# Patient Record
Sex: Female | Born: 1985 | Race: Black or African American | Hispanic: No | Marital: Single | Smoking: Current every day smoker
Health system: Southern US, Community
[De-identification: ages and names within clinical notes are randomized; demographics above are authoritative.]

## PROBLEM LIST (undated history)

## (undated) DIAGNOSIS — J45909 Unspecified asthma, uncomplicated: Secondary | ICD-10-CM

---

## 2011-07-22 ENCOUNTER — Encounter: Payer: Self-pay | Admitting: Emergency Medicine

## 2011-07-22 ENCOUNTER — Emergency Department (HOSPITAL_BASED_OUTPATIENT_CLINIC_OR_DEPARTMENT_OTHER)
Admission: EM | Admit: 2011-07-22 | Discharge: 2011-07-22 | Disposition: A | Discharge status: Home / Self Care | Payer: Self-pay | Attending: Emergency Medicine | Admitting: Emergency Medicine

## 2011-07-22 DIAGNOSIS — F17200 Nicotine dependence, unspecified, uncomplicated: Secondary | ICD-10-CM | POA: Insufficient documentation

## 2011-07-22 DIAGNOSIS — J069 Acute upper respiratory infection, unspecified: Secondary | ICD-10-CM | POA: Insufficient documentation

## 2011-07-22 LAB — RAPID STREP SCREEN (MED CTR MEBANE ONLY): Streptococcus, Group A Screen (Direct): NEGATIVE

## 2011-07-22 NOTE — ED Provider Notes (Signed)
History     CSN: [id number] Arrival date & time: 07/22/2011 12:47 PM  Chief Complaint  Patient presents with  . Sore Throat    HPI  (Consider location/radiation/quality/duration/timing/severity/associated sxs/prior treatment)  Patient is a 25 y.o. female presenting with pharyngitis. The history is provided by the patient. No language interpreter was used.  Sore Throat This is a new problem. The current episode started today. The problem occurs constantly. The problem has been unchanged. Associated symptoms include congestion and a sore throat. The symptoms are aggravated by nothing. She has tried nothing for the symptoms. The treatment provided moderate relief.  Pt reports she has a cough and congestion.  Pt reports throat is sore.  History reviewed. No pertinent past medical history.  History reviewed. No pertinent past surgical history.  No family history on file.  History  Substance Use Topics  . Smoking status: Current Everyday Smoker  . Smokeless tobacco: Not on file  . Alcohol Use: No    OB History    Grav Para Term Preterm Abortions TAB SAB Ect Mult Living                  Review of Systems  Review of Systems  HENT: Positive for congestion, sore throat and rhinorrhea.   All other systems reviewed and are negative.    Allergies  Review of patient's allergies indicates no known allergies.  Home Medications  No current outpatient prescriptions on file.  Physical Exam    BP 133/70  Pulse 101  Temp 99 F (37.2 C)  Resp 18  SpO2 100%  LMP 07/20/2011  Physical Exam  Nursing note and vitals reviewed. Constitutional: She is oriented to person, place, and time. She appears well-developed and well-nourished.  HENT:  Head: Normocephalic and atraumatic.  Right Ear: External ear normal.  Left Ear: External ear normal.  Nose: Nose normal.  Mouth/Throat: Oropharynx is clear and moist.  Eyes: Conjunctivae and EOM are normal. Pupils are equal, round, and  reactive to light.  Neck: Normal range of motion. Neck supple.  Cardiovascular: Normal rate and regular rhythm.   Pulmonary/Chest: Effort normal and breath sounds normal.  Abdominal: Soft. Bowel sounds are normal.  Musculoskeletal: Normal range of motion.  Neurological: She is alert and oriented to person, place, and time. She has normal reflexes.  Skin: Skin is warm and dry.  Psychiatric: She has a normal mood and affect.    ED Course  Procedures (including critical care time)   Labs Reviewed  RAPID STREP SCREEN   No results found.   No diagnosis found.   MDM Pt counseled on viral illness.          Langston Masker, [place] 07/22/11 1342

## 2011-07-22 NOTE — ED Notes (Signed)
Pt c/o sore throat & nasal congestion since last pm; no relief w/ Alka- Seltzer at home.

## 2011-07-29 NOTE — ED Provider Notes (Signed)
Medical screening examination/treatment/procedure(s) were performed by non-physician practitioner and as supervising physician I was immediately available for consultation/collaboration.   Geoffery Lyons, MD 07/29/11 [phone]

## 2013-12-25 ENCOUNTER — Encounter (HOSPITAL_BASED_OUTPATIENT_CLINIC_OR_DEPARTMENT_OTHER): Payer: Self-pay | Admitting: Emergency Medicine

## 2013-12-25 ENCOUNTER — Emergency Department (HOSPITAL_BASED_OUTPATIENT_CLINIC_OR_DEPARTMENT_OTHER)
Admission: EM | Admit: 2013-12-25 | Discharge: 2013-12-25 | Disposition: A | Discharge status: Home / Self Care | Payer: Self-pay | Attending: Emergency Medicine | Admitting: Emergency Medicine

## 2013-12-25 DIAGNOSIS — Z87891 Personal history of nicotine dependence: Secondary | ICD-10-CM | POA: Insufficient documentation

## 2013-12-25 DIAGNOSIS — R51 Headache: Secondary | ICD-10-CM | POA: Insufficient documentation

## 2013-12-25 DIAGNOSIS — R519 Headache, unspecified: Secondary | ICD-10-CM

## 2013-12-25 DIAGNOSIS — Z3202 Encounter for pregnancy test, result negative: Secondary | ICD-10-CM | POA: Insufficient documentation

## 2013-12-25 DIAGNOSIS — R1110 Vomiting, unspecified: Secondary | ICD-10-CM | POA: Insufficient documentation

## 2013-12-25 LAB — URINALYSIS, ROUTINE W REFLEX MICROSCOPIC
BILIRUBIN URINE: NEGATIVE
GLUCOSE, UA: NEGATIVE mg/dL
HGB URINE DIPSTICK: NEGATIVE
KETONES UR: NEGATIVE mg/dL
Nitrite: NEGATIVE
PH: 7.5 (ref 5.0–8.0)
Protein, ur: NEGATIVE mg/dL
Specific Gravity, Urine: 1.027 (ref 1.005–1.030)
Urobilinogen, UA: 1 mg/dL (ref 0.0–1.0)

## 2013-12-25 LAB — URINE MICROSCOPIC-ADD ON

## 2013-12-25 LAB — BASIC METABOLIC PANEL
BUN: 16 mg/dL (ref 6–23)
CHLORIDE: 101 meq/L (ref 96–112)
CO2: 22 mEq/L (ref 19–32)
Calcium: 10 mg/dL (ref 8.4–10.5)
Creatinine, Ser: 0.7 mg/dL (ref 0.50–1.10)
GFR calc non Af Amer: >90 mL/min (ref >90)
Glucose, Bld: 83 mg/dL (ref 70–99)
POTASSIUM: 4.3 meq/L (ref 3.7–5.3)
Sodium: 138 mEq/L (ref 137–147)

## 2013-12-25 LAB — PREGNANCY, URINE: Preg Test, Ur: NEGATIVE

## 2013-12-25 MED ORDER — KETOROLAC TROMETHAMINE 30 MG/ML IJ SOLN
30.0000 mg | Freq: Once | INTRAMUSCULAR | Status: AC
  Administered 2013-12-25: 30 mg via INTRAVENOUS
  Filled 2013-12-25: qty 1

## 2013-12-25 MED ORDER — ONDANSETRON 4 MG PO TBDP: 4.0000 mg | ORAL_TABLET | Freq: Three times a day (TID) | ORAL | Status: DC | PRN

## 2013-12-25 MED ORDER — ONDANSETRON HCL 4 MG/2ML IJ SOLN
4.0000 mg | Freq: Once | INTRAMUSCULAR | Status: AC
  Administered 2013-12-25: 4 mg via INTRAVENOUS
  Filled 2013-12-25: qty 2

## 2013-12-25 MED ORDER — SODIUM CHLORIDE 0.9 % IV BOLUS (SEPSIS)
1000.0000 mL | Freq: Once | INTRAVENOUS | Status: AC
  Administered 2013-12-25: 1000 mL via INTRAVENOUS

## 2013-12-25 NOTE — Discharge Instructions (Signed)

## 2013-12-25 NOTE — ED Provider Notes (Signed)
CSN: [id number]     Arrival date & time 12/25/13  1345 History   First MD Initiated Contact with Patient 12/25/13 1359     Chief Complaint  Patient presents with  . Headache     (Consider location/radiation/quality/duration/timing/severity/associated sxs/prior Treatment) HPI Comments: Pt states that she started vomiting last night and she has had multiple episodes of vomiting and diarrhea today. Pt states that she is have abdominal cramping;lmp was 2 weeks ago  The history is provided by the patient. No language interpreter was used.    History reviewed. No pertinent past medical history. History reviewed. No pertinent past surgical history. No family history on file. History  Substance Use Topics  . Smoking status: Former Games developermoker  . Smokeless tobacco: Not on file  . Alcohol Use: No   OB History   Grav Para Term Preterm Abortions TAB SAB Ect Mult Living                 Review of Systems  Constitutional: Negative.   Respiratory: Negative.   Cardiovascular: Negative.       Allergies  Review of patient's allergies indicates no known allergies.  Home Medications  No current outpatient prescriptions on file. BP 133/71  Pulse 96  Resp 16  SpO2 100%  LMP 12/12/2013 Physical Exam  Nursing note and vitals reviewed. Constitutional: She is oriented to person, place, and time. She appears well-developed and well-nourished.  HENT:  Head: Normocephalic and atraumatic.  Cardiovascular: Normal rate and regular rhythm.   Pulmonary/Chest: Effort normal and breath sounds normal.  Abdominal: Soft. Bowel sounds are normal. There is no tenderness.  Musculoskeletal: Normal range of motion.  Neurological: She is oriented to person, place, and time.  Skin: Skin is warm and dry.  Psychiatric: She has a normal mood and affect.    ED Course  Procedures (including critical care time) Labs Review Labs Reviewed  URINALYSIS, ROUTINE W REFLEX MICROSCOPIC - Abnormal; Notable for the  following:    Leukocytes, UA SMALL (*)    All other components within normal limits  URINE CULTURE  PREGNANCY, URINE  BASIC METABOLIC PANEL  URINE MICROSCOPIC-ADD ON   Imaging Review No results found.  EKG Interpretation   None       MDM   Final diagnoses:  Vomiting  Headache    Pt is feeling better and tolerating WU:JWJXpo:will send home with zofran:no red flags associated with headache    Teressa LowerVrinda Jenilyn Magana, NP 12/25/13 1705

## 2013-12-25 NOTE — ED Notes (Signed)
D/c home with ride- rx x 1 given for zofran

## 2013-12-25 NOTE — ED Notes (Signed)
C/o HA across forehead x 2-3 hrs-vomited x 2 at work-left work-vomited x 2 since then-NAD-steady gait to tx area

## 2013-12-25 NOTE — ED Notes (Signed)
Pt given water 

## 2013-12-26 LAB — URINE CULTURE
Colony Count: NO GROWTH
Culture: NO GROWTH

## 2013-12-26 NOTE — ED Provider Notes (Signed)
Medical screening examination/treatment/procedure(s) were performed by non-physician practitioner and as supervising physician I was immediately available for consultation/collaboration.  EKG Interpretation   None         Dagmar HaitWilliam Ova Meegan, MD 12/26/13 [phone]

## 2014-08-24 ENCOUNTER — Encounter (HOSPITAL_BASED_OUTPATIENT_CLINIC_OR_DEPARTMENT_OTHER): Payer: Self-pay | Admitting: Emergency Medicine

## 2014-08-24 ENCOUNTER — Emergency Department (HOSPITAL_BASED_OUTPATIENT_CLINIC_OR_DEPARTMENT_OTHER)
Admission: EM | Admit: 2014-08-24 | Discharge: 2014-08-24 | Disposition: A | Discharge status: Home / Self Care | Payer: Self-pay | Attending: Emergency Medicine | Admitting: Emergency Medicine

## 2014-08-24 DIAGNOSIS — J45901 Unspecified asthma with (acute) exacerbation: Secondary | ICD-10-CM | POA: Insufficient documentation

## 2014-08-24 DIAGNOSIS — J069 Acute upper respiratory infection, unspecified: Secondary | ICD-10-CM | POA: Insufficient documentation

## 2014-08-24 DIAGNOSIS — R062 Wheezing: Secondary | ICD-10-CM

## 2014-08-24 DIAGNOSIS — Z87891 Personal history of nicotine dependence: Secondary | ICD-10-CM | POA: Insufficient documentation

## 2014-08-24 HISTORY — DX: Unspecified asthma, uncomplicated: J45.909

## 2014-08-24 LAB — RAPID STREP SCREEN (MED CTR MEBANE ONLY): Streptococcus, Group A Screen (Direct): NEGATIVE

## 2014-08-24 MED ORDER — DM-GUAIFENESIN ER 30-600 MG PO TB12: 1.0000 | ORAL_TABLET | Freq: Two times a day (BID) | ORAL | Status: DC

## 2014-08-24 MED ORDER — AEROCHAMBER PLUS FLO-VU MEDIUM MISC
1.0000 | Freq: Once | Status: AC
  Administered 2014-08-24: 1
  Filled 2014-08-24: qty 1

## 2014-08-24 MED ORDER — ALBUTEROL SULFATE HFA 108 (90 BASE) MCG/ACT IN AERS
2.0000 | INHALATION_SPRAY | Freq: Once | RESPIRATORY_TRACT | Status: AC
  Administered 2014-08-24: 2 via RESPIRATORY_TRACT
  Filled 2014-08-24: qty 6.7

## 2014-08-24 NOTE — ED Notes (Signed)
Reports she woke up with sore throat this morning - c/o SOB

## 2014-08-24 NOTE — ED Provider Notes (Signed)
Medical screening examination/treatment/procedure(s) were performed by non-physician practitioner and as supervising physician I was immediately available for consultation/collaboration.     Emelina Hinch, MD 08/24/14 2259 

## 2014-08-24 NOTE — Discharge Instructions (Signed)
Call for a follow up appointment with a Family or Primary Care Provider.  °Return if Symptoms worsen.   °Take medication as prescribed.  °Salt water gargles 3-4 times a day. °Drink plenty of fluids. ° ° ° °Emergency Department Resource Guide °1) Find a Doctor and Pay Out of Pocket °Although you won't have to find out who is covered by your insurance plan, it is a good idea to ask around and get recommendations. You will then need to call the office and see if the doctor you have chosen will accept you as a new patient and what types of options they offer for patients who are self-pay. Some doctors offer discounts or will set up payment plans for their patients who do not have insurance, but you will need to ask so you aren't surprised when you get to your appointment. ° °2) Contact Your Local Health Department °Not all health departments have doctors that can see patients for sick visits, but many do, so it is worth a call to see if yours does. If you don't know where your local health department is, you can check in your phone book. The CDC also has a tool to help you locate your state's health department, and many state websites also have listings of all of their local health departments. ° °3) Find a Walk-in Clinic °If your illness is not likely to be very severe or complicated, you may want to try a walk in clinic. These are popping up all over the country in pharmacies, drugstores, and shopping centers. They're usually staffed by nurse practitioners or physician assistants that have been trained to treat common illnesses and complaints. They're usually fairly quick and inexpensive. However, if you have serious medical issues or chronic medical problems, these are probably not your best option. ° °No Primary Care Doctor: °- Call [facility] Connect at  832-8000 - they can help you locate a primary care doctor that  accepts your insurance, provides certain services, etc. °- Physician Referral Service-  1-800-533-3463 ° °Chronic Pain Problems: °Organization         Address  Phone   Notes  °[facility]  (336) 297-2271 Patients need to be referred by their primary care doctor.  ° °Medication Assistance: °Organization         Address  Phone   Notes  °[place] [facility] [address] °[place], [place] [address] (336) 641-8030 --Must be a resident of [place] °-- Must have NO insurance coverage whatsoever (no Medicaid/ Medicare, etc.) °-- The pt. MUST have a primary care doctor that directs their care regularly and follows them in the community °  °[facility]  (866) 331-1348   °[employer]  (888) 892-1162   ° °Agencies that provide inexpensive medical care: °Organization         Address  Phone   Notes  °[facility]  (336) 832-8035   °[facility]    (336) 832-7272   °[facility] [address] °[place], [place] [address] (336) 832-4777   °[facility] [address], °[place] (336) 271-4999   °[facility]    (336) 373-0678   °[facility]    (336) 272-1050   °[facility] ° [address], [place] Phone:  (336) 832-4444, Fax:  (336) 832-4440 Hours of Operation:  9 am - 6 pm, M-F.  Also accepts Medicaid/Medicare and self-pay.  °[facility] ° [address], [place] Phone: (336) 832-3150, Fax: (336) 832-3151. Hours of Operation:  8:30 am - 5:30 pm, M-F.  Also accepts Medicaid and self-pay.  °[facility] [place] [address], [place] Phone: (336) 878-6027   °[facility] [address], [place], [place] (336)723-1848, Ext. 123 Mondays & Thursdays: 7-9 AM.  First 15 patients are seen on a first come, first serve basis. °  ° °Medicaid-accepting [place] Providers: ° °Organization         Address  Phone   Notes  °[facility] [address],  [place] (336) 641-2100 Also accepts self-pay patients.  °[facility] [address], [place] ° (336) 856-9996   °[facility] [address], [place] (336) 288-8857   °[facility] [address] [place] [address], [place] (336) 299-7000   °Ve[facility] [address], [place]  ° (336) 373-1557 Only accepts [place] Access Medicaid patients after they have their name applied to their card.  ° °Self-Pay (no insurance) in [place]: ° °Organization         Address  Phone   Notes  °Sickle Cell Patients, [facility] [address], [place] (336) 832-1970   ° [facility] [address], [place] (336) 832-4400   ° [facility] ° [address], [place] (336) 992-4800   °[facility]/Dr. Osei-Bonsu ° [address] [place] [address], [place] or [address], [place] (336) 841-8500 Phone number for both [place] and [place] locations is the same.  °[facility] [address], [place] (336) 299-0000   °[facility] [address] [place] [address], [place] or [address] (336) 852-7530 °(336) 878-2260   °[facility] [address], [place] (336) 350-1642, phone; (336) 294-5005, fax Sees patients 1st and 3rd Saturday of every month.  Must not qualify for public or private insurance (i.e. Medicaid, Medicare, [place] Health Choice, Veterans' Benefits) • Household income should be no more than 200% of the poverty level •The clinic cannot treat you if you are pregnant or think you are pregnant • Sexually transmitted diseases are not treated at the clinic.  ° ° °Dental Care: °Organization         Address  Phone  Notes  °[facility] [address], [place] (336) 641-6152 Accepts children up to age 21 who are enrolled in  Medicaid or [place] Health Choice; pregnant women with a Medicaid card; and children who have applied for Medicaid or [place] Health Choice, but were declined, whose parents can pay a reduced fee at time of service.  °[facility] [place]  [address], [place] (336) 641-7733 Accepts children up to age 21 who are enrolled in Medicaid or [place] Health Choice; pregnant women with a Medicaid card; and children who have applied for Medicaid or  Health Choice, but were declined, whose parents can pay a reduced fee at time of service.  °[facility] ° [address], [place] (336) 641-4533 Patients are seen by appointment only. Walk-ins are not accepted. [facility] will see patients 18 years of age and older. °Monday - Tuesday (8am-5pm) °Most Wednesdays (8:30-5pm) °$30 per visit,   cash only  °[facility] ° [address], [place] (336) 641-4533 Patients are seen by appointment only. Walk-ins are not accepted. [facility] will see patients 18 years of age and older. °One Wednesday Evening (Monthly: Volunteer Based).  $30 per visit, cash only  °[facility]  (919) 537-3737 for adults; Children under age 4, call Graduate Pediatric Dentistry at (919) [phone]. Children aged 4-14, please call (919) [phone] to request a pediatric application. ° Dental services are provided in all areas of dental care including fillings, crowns and bridges, complete and partial dentures, implants, gum treatment, root canals, and extractions. Preventive care is also provided. Treatment is provided to both adults and children. °Patients are selected via a lottery and there is often a waiting list. °  °[facility] [address], °[place] ° (336) 763-8833 [email or web address] °  °[facility] [address], [place], [place] (336)723-1848, Ext. 123 Second and Fourth Thursday of each month, opens at 6:30  AM; Clinic ends at 9 AM.  Patients are seen on a first-come first-served basis, and a limited number are seen during each clinic.  ° °[facility] ° [address], [place], [place] (336) 723-7904   Eligibility Requirements °You must have lived in [place], [place], or [place] counties for at least the last three months. °  You cannot be eligible for state or federal sponsored healthcare insurance, including Veterans Administration, Medicaid, or Medicare. °  You generally cannot be eligible for healthcare insurance through your employer.  °  How to apply: °Eligibility screenings are held every Tuesday and Wednesday afternoon from 1:00 pm until 4:00 pm. You do not need an appointment for the interview!  °[facility] [address], [place], [place] 336-631-2330   °[facility]  336-342-8273   °[facility]  336-703-3100   °[facility]  336-570-6415   ° °Behavioral Health Resources in the Community: °Intensive Outpatient Programs °Organization         Address  Phone  Notes  °[facility] [address], [place], [place] 336-878-6098   °[facility] [address], [place], [place] 336-832-9800   °[facility] [address], [place], [place] ° 336-882-2125   °[facility] [address],  °[place], [place] 1-800-853-5163 or 336-641-4981   °Substance Abuse Resources °Organization         Address  Phone  Notes  °[facility]  336-882-2125   °[facility]  336-784-9470   °[facility]  336-285-9073   °[facility]  336-845-3988   °Residential & Outpatient Substance Abuse Program  1-800-659-3381   °Psychological Services °Organization         Address  Phone  Notes  °[facility]  336- 832-9600   °[facility]  336- 378-7881   °[facility] [address], [place] 1-800-853-5163 or  336-641-4981   ° °Mobile Crisis Teams °Organization         Address  Phone  Notes  °[facility], Mobile Crisis Care Unit  1-877-626-1772   °Assertive °[facility] ° [address] [place], [place] 336-834-9664   °Sharon DeEsch [address] °[place] 336-554-5454   ° °Self-Help/Support Groups °Organization         Address  Phone               Notes  °[facility] [place] - variety of support groups  336- 373-1402 Call for more information  °Narcotics Anonymous (NA), [facility] [address], °[place]  2 meetings at this location  ° °Residential Treatment Programs °Organization         Address  Phone  Notes  °[facility] [address],    °[place]  1-866-801-8205   °[facility] ° [address], [place], [place] 704-293-8524   °[facility] [address], [place] 336-845-3988 Admissions: 8am-3pm M-F  °[facility] [address]    °[place], [place] 336-841-1104   °[facility] [address], [place], [place] 336-379-7146   °[facility] [address]  °[place], [place] 336-285-9073   °[facility] - Intensive Outpatient [address], [place], [place] 336-852-3033   °[facility] [address]  °[place], [place] 1-877-615-2722 or 336-784-9470   °[facility] [address] [place], [place] 336-227-7417 Accepts Medicaid  °[facility] [address]  °[place] 1-800-659-3381 Substance Abuse/Addiction Treatment  ° °[place] Behavioral Health Resources °Organization         Address  Phone  Notes  °[facility]  (888) 581-9988   °Julie Brannon, PhD [address] [place], [place]   (336) 349-5553 or (336) 951-0000   °[facility]   [address] °[place], [place] (336) 349-4454   °[facility] [address],  [place], [place] (336) 342-8316 Insurance/Medicaid/sponsorship through [facility]  °[facility] [address]                                    [place], [place] (336) 342-8316 Therapy/tele-psych/case  °[facility] [address]  ° [place], [place] (336) 349-2233    °Dr. Arfeen  (336) 349-4544   °[facility] [place]  [employer] [place] [facility] 1) [address], [place] °2) [address], [place] °3)  [address], [place] (336) 349-3220 °(336) 342-7768 ° °(336) 342-8140   °[place] [facility] (336) 342-1394 or (336) 342-3537 (After Hours)    ° °

## 2014-08-24 NOTE — ED Provider Notes (Signed)
CSN: [id number]     Arrival date & time 08/24/14  1712 History   First MD Initiated Contact with Patient 08/24/14 1724     Chief Complaint  Patient presents with  . Sore Throat     (Consider location/radiation/quality/duration/timing/severity/associated sxs/prior Treatment) HPI Comments: The patient is a 28 year old female past medical history of asthma presenting to the emergency Department chief complaint of sore throat since this morning. Patient reports worsened with swallowing. Patient also complains of cough. Patient reports multiple episodes of coughing spells this morning, with one episode of lightheadedness after spell, no syncope. Patient denies fever, chills. Patient denies known sick contacts. Last inhaler use over 1 year ago no PCP. Reports taking chloraseptic lozengers without full resolution of symptoms.  The history is provided by the patient. No language interpreter was used.    Past Medical History  Diagnosis Date  . Asthma    History reviewed. No pertinent past surgical history. No family history on file. History  Substance Use Topics  . Smoking status: Former Games developermoker  . Smokeless tobacco: Never Used  . Alcohol Use: No   OB History   Grav Para Term Preterm Abortions TAB SAB Ect Mult Living                 Review of Systems  Constitutional: Negative for fever and chills.  HENT: Positive for sore throat. Negative for ear pain.   Respiratory: Positive for cough.       Allergies  Review of patient's allergies indicates no known allergies.  Home Medications   Prior to Admission medications   Medication Sig Start Date End Date Taking? Authorizing Provider  ondansetron (ZOFRAN ODT) 4 MG disintegrating tablet Take 1 tablet (4 mg total) by mouth every 8 (eight) hours as needed for nausea or vomiting. 12/25/13   Teressa LowerVrinda Pickering, NP   BP 109/65  Pulse 88  Temp(Src) 98.5 F (36.9 C) (Oral)  Resp 24  SpO2 94%  LMP 08/21/2014 Physical Exam  Nursing note  and vitals reviewed. Constitutional: She appears well-developed and well-nourished. No distress.  Obese female  HENT:  Head: Normocephalic and atraumatic.  Right Ear: External ear normal.  Left Ear: External ear normal.  Mouth/Throat: No oropharyngeal exudate.  Neck: Normal range of motion.  Cardiovascular: Normal rate and regular rhythm.   Pulmonary/Chest: Effort normal. She has wheezes. She has no rales. She exhibits no tenderness.  Patient is able to speak in complete sentences. Minimal expiratory wheezing at bases.  Abdominal: Soft.  Skin: Skin is warm and dry. She is not diaphoretic.  Psychiatric: She has a normal mood and affect. Her behavior is normal.    ED Course  Procedures (including critical care time) Labs Review Labs Reviewed  RAPID STREP SCREEN  CULTURE, GROUP A STREP    Imaging Review No results found.   EKG Interpretation None      MDM   Final diagnoses:  URI (upper respiratory infection)  Wheezing   Pt rapid strep negative. Patients symptoms are consistent with URI, likely viral etiology. Afebrile. Patient has minimal expiratory wheezing, history of asthma out of inhaler will give 1 dose and inhaler for symptomatically treatment resources given. Discussed that antibiotics are not indicated for viral infections. Pt will be discharged with symptomatic treatment.  Verbalizes understanding and is agreeable with plan. Pt is hemodynamically stable & in NAD prior to dc. Meds given in ED:  Medications  albuterol (PROVENTIL HFA;VENTOLIN HFA) 108 (90 BASE) MCG/ACT inhaler 2 puff (2 puffs Inhalation Given  08/24/14 1744)  AEROCHAMBER PLUS FLO-VU MEDIUM device MISC 1 each (1 each Other Given 08/24/14 1747)    New Prescriptions   DEXTROMETHORPHAN-GUAIFENESIN (MUCINEX DM) 30-600 MG PER 12 HR TABLET    Take 1 tablet by mouth 2 (two) times daily.       Mellody DrownLauren Kalynne Womac, PA-C 08/24/14 [phone]

## 2014-08-26 LAB — CULTURE, GROUP A STREP

## 2015-03-15 ENCOUNTER — Emergency Department (HOSPITAL_BASED_OUTPATIENT_CLINIC_OR_DEPARTMENT_OTHER)
Admission: EM | Admit: 2015-03-15 | Discharge: 2015-03-16 | Disposition: A | Discharge status: Home / Self Care | Payer: Managed Care, Other (non HMO) | Attending: Emergency Medicine | Admitting: Emergency Medicine

## 2015-03-15 ENCOUNTER — Emergency Department (HOSPITAL_BASED_OUTPATIENT_CLINIC_OR_DEPARTMENT_OTHER): Payer: Managed Care, Other (non HMO)

## 2015-03-15 ENCOUNTER — Encounter (HOSPITAL_BASED_OUTPATIENT_CLINIC_OR_DEPARTMENT_OTHER): Payer: Self-pay | Admitting: *Deleted

## 2015-03-15 DIAGNOSIS — Z792 Long term (current) use of antibiotics: Secondary | ICD-10-CM | POA: Insufficient documentation

## 2015-03-15 DIAGNOSIS — Z3A12 12 weeks gestation of pregnancy: Secondary | ICD-10-CM | POA: Diagnosis not present

## 2015-03-15 DIAGNOSIS — Y9301 Activity, walking, marching and hiking: Secondary | ICD-10-CM | POA: Insufficient documentation

## 2015-03-15 DIAGNOSIS — S93402A Sprain of unspecified ligament of left ankle, initial encounter: Secondary | ICD-10-CM

## 2015-03-15 DIAGNOSIS — W01198A Fall on same level from slipping, tripping and stumbling with subsequent striking against other object, initial encounter: Secondary | ICD-10-CM | POA: Diagnosis not present

## 2015-03-15 DIAGNOSIS — Y998 Other external cause status: Secondary | ICD-10-CM | POA: Diagnosis not present

## 2015-03-15 DIAGNOSIS — Y9289 Other specified places as the place of occurrence of the external cause: Secondary | ICD-10-CM | POA: Diagnosis not present

## 2015-03-15 DIAGNOSIS — J45909 Unspecified asthma, uncomplicated: Secondary | ICD-10-CM | POA: Insufficient documentation

## 2015-03-15 DIAGNOSIS — O9A211 Injury, poisoning and certain other consequences of external causes complicating pregnancy, first trimester: Secondary | ICD-10-CM | POA: Insufficient documentation

## 2015-03-15 DIAGNOSIS — Z87891 Personal history of nicotine dependence: Secondary | ICD-10-CM | POA: Diagnosis not present

## 2015-03-15 DIAGNOSIS — M545 Low back pain, unspecified: Secondary | ICD-10-CM

## 2015-03-15 DIAGNOSIS — S3992XA Unspecified injury of lower back, initial encounter: Secondary | ICD-10-CM | POA: Diagnosis not present

## 2015-03-15 DIAGNOSIS — Z3490 Encounter for supervision of normal pregnancy, unspecified, unspecified trimester: Secondary | ICD-10-CM

## 2015-03-15 DIAGNOSIS — O99511 Diseases of the respiratory system complicating pregnancy, first trimester: Secondary | ICD-10-CM | POA: Diagnosis not present

## 2015-03-15 DIAGNOSIS — Z79899 Other long term (current) drug therapy: Secondary | ICD-10-CM | POA: Insufficient documentation

## 2015-03-15 DIAGNOSIS — W19XXXA Unspecified fall, initial encounter: Secondary | ICD-10-CM

## 2015-03-15 MED ORDER — OXYCODONE-ACETAMINOPHEN 5-325 MG PO TABS
1.0000 | ORAL_TABLET | Freq: Once | ORAL | Status: AC
  Administered 2015-03-15: 1 via ORAL
  Filled 2015-03-15: qty 1

## 2015-03-15 NOTE — ED Provider Notes (Signed)
CSN: [id number]     Arrival date & time 03/15/15  2159 History  This chart was scribed for Shelly ScottMartha Linker, MD by Abel PrestoKara Demonbreun, ED Scribe. This patient was seen in room MH02/MH02 and the patient's care was started at 10:24 PM.    Chief Complaint  Patient presents with  . Fall     The history is provided by the patient.   HPI Comments: Shelly Ramos is a 29 y.o. female who presents to the Emergency Department complaining of fall down 4 steps. Pt states she was walking down staircase when bannister she was using to stabilize herself gave way, causing her to fall onto her left side. Denies striking her head.  No LOC, no vomiting, no seizure activity. Pt was able to ambulate afterwards. Pt notes associated left hip pain, back pain, and left ankle pain. Pt has not taken any medication for relief. Pt is [redacted] weeks pregnant. No abdominal pain, no vaginal bleeding, Pt tearful in exam room but states she is mostly concerned about the fetus. Pt has NKDA. Pt denies abdominal pain, LOC, vaginal bleeding, and neck pain.   Past Medical History  Diagnosis Date  . Asthma    History reviewed. No pertinent past surgical history. No family history on file. History  Substance Use Topics  . Smoking status: Former Games developermoker  . Smokeless tobacco: Never Used  . Alcohol Use: No   OB History    Gravida Para Term Preterm AB TAB SAB Ectopic Multiple Living   1              Review of Systems  Gastrointestinal: Negative for abdominal pain.  Genitourinary: Negative for vaginal bleeding.  Musculoskeletal: Positive for myalgias, back pain and arthralgias. Negative for neck pain.  Neurological: Negative for syncope and headaches.  All other systems reviewed and are negative.     Allergies  Review of patient's allergies indicates no known allergies.  Home Medications   Prior to Admission medications   Medication Sig Start Date End Date Taking? Authorizing Provider  MetroNIDAZOLE (FLAGYL PO) Take by mouth.  States she is currently being treated for trich. Thinks she is taking flagyl   Yes Historical Provider, MD  Prenatal Vit-Fe Fumarate-FA (PRENATAL MULTIVITAMIN) TABS tablet Take 1 tablet by mouth daily at 12 noon.   Yes Historical Provider, MD  dextromethorphan-guaiFENesin (MUCINEX DM) 30-600 MG per 12 hr tablet Take 1 tablet by mouth 2 (two) times daily. 08/24/14   Mellody DrownLauren Parker, PA-C  ondansetron (ZOFRAN ODT) 4 MG disintegrating tablet Take 1 tablet (4 mg total) by mouth every 8 (eight) hours as needed for nausea or vomiting. 12/25/13   Teressa LowerVrinda Pickering, NP   BP 116/66 mmHg  Pulse 76  Temp(Src) 97.6 F (36.4 C) (Oral)  Resp 18  Ht 5\' 8"  (1.727 m)  Wt 228 lb (103.42 kg)  BMI 34.68 kg/m2  SpO2 100%  LMP 12/26/2014 (Approximate)  Vitals reviewed Physical Exam  Physical Examination: General appearance - alert, well appearing, and in no distress Mental status - alert, oriented to person, place, and time Head- NCAT Eyes - no conjunctival injection, no scleral icterus Neck - no midline tenderness to palpation Chest - clear to auscultation, no wheezes, rales or rhonchi, symmetric air entry Heart - normal rate, regular rhythm, normal S1, S2, no murmurs, rubs, clicks or gallops Abdomen - soft, nontender, nondistended, no masses or organomegaly, nabs Back exam - no midline tenderness to palpation, left sided paraspinal tenderness in lumbar region,  Mild left CVA tenderness  Neurological - alert, oriented x 3, strength 5/5 in extremities x 4, sensation intact Musculoskeletal -ttp over lateral left ankle, mild ttp overa lateral left hip but no pain with internal or external rotation of hip joint , otherwise no joint tenderness, deformity or swelling Extremities - peripheral pulses normal, no pedal edema, no clubbing or cyanosis Skin - normal coloration and turgor, no rashes Psych- anxious, tearful  ED Course  Procedures (including critical care time) DIAGNOSTIC STUDIES: Oxygen Saturation is 100%  on room air, normal by my interpretation.    COORDINATION OF CARE: 10:28 PM Discussed treatment plan with patient at beside, the patient agrees with the plan and has no further questions at this time.   Labs Review Labs Reviewed  URINALYSIS, ROUTINE W REFLEX MICROSCOPIC - Abnormal; Notable for the following:    Leukocytes, UA TRACE (*)    All other components within normal limits  PREGNANCY, URINE - Abnormal; Notable for the following:    Preg Test, Ur POSITIVE (*)    All other components within normal limits  URINE MICROSCOPIC-ADD ON - Abnormal; Notable for the following:    Squamous Epithelial / LPF FEW (*)    Bacteria, UA FEW (*)    All other components within normal limits    Imaging Review No results found.   EKG Interpretation None      MDM   Final diagnoses:  Fall, initial encounter  Left ankle sprain, initial encounter  Left-sided low back pain without sciatica  Pregnancy  pt presenting after fall down several stairs- approx 3- she is [redacted] weeks pregnant.  No abdominal pain, no vaginal bleeding.  Pain in left ankle, left hip and left back.  Xray of ankle obtained, doubt fracture of hip as no significant bony tenderness,  No pain with ROM.  No midline lumbar tenderness.  Will obtain urine to ensure no gross hematuria.  No indication of fetal harm as no abdominal pain or tenderness and no bleeding.  Pt signed out pending urine and xray of ankle/reassessment.    I personally performed the services described in this documentation, which was scribed in my presence. The recorded information has been reviewed and is accurate.     Shelly ScottMartha Linker, MD 03/17/15 [phone]

## 2015-03-15 NOTE — ED Notes (Addendum)
Pt states that she was walking down the stairs at her brother's house pta and the rail broke loose causing her to fall hitting her left flank/back area on the wooden steps. States she fell down a couple stairs.  Pt tearful on arrival. Denies any loc. States that she is [redacted] weeks pregnant. Also c/o left hip hurting and left ankle. Denies any vaginal bleeding or abd pain.  Was able to ambulate but painful.

## 2015-03-16 LAB — URINE MICROSCOPIC-ADD ON

## 2015-03-16 LAB — URINALYSIS, ROUTINE W REFLEX MICROSCOPIC
BILIRUBIN URINE: NEGATIVE
Glucose, UA: NEGATIVE mg/dL
Hgb urine dipstick: NEGATIVE
Ketones, ur: NEGATIVE mg/dL
NITRITE: NEGATIVE
PROTEIN: NEGATIVE mg/dL
SPECIFIC GRAVITY, URINE: 1.013 (ref 1.005–1.030)
UROBILINOGEN UA: 1 mg/dL (ref 0.0–1.0)
pH: 7 (ref 5.0–8.0)

## 2015-03-16 LAB — PREGNANCY, URINE: Preg Test, Ur: POSITIVE — AB

## 2015-03-16 NOTE — Discharge Instructions (Signed)
Ankle Sprain °An ankle sprain is an injury to the strong, fibrous tissues (ligaments) that hold the bones of your ankle joint together.  °CAUSES °An ankle sprain is usually caused by a fall or by twisting your ankle. Ankle sprains most commonly occur when you step on the outer edge of your foot, and your ankle turns inward. People who participate in sports are more prone to these types of injuries.  °SYMPTOMS  °· Pain in your ankle. The pain may be present at rest or only when you are trying to stand or walk. °· Swelling. °· Bruising. Bruising may develop immediately or within 1 to 2 days after your injury. °· Difficulty standing or walking, particularly when turning corners or changing directions. °DIAGNOSIS  °Your caregiver will ask you details about your injury and perform a physical exam of your ankle to determine if you have an ankle sprain. During the physical exam, your caregiver will press on and apply pressure to specific areas of your foot and ankle. Your caregiver will try to move your ankle in certain ways. An X-ray exam may be done to be sure a bone was not broken or a ligament did not separate from one of the bones in your ankle (avulsion fracture).  °TREATMENT  °Certain types of braces can help stabilize your ankle. Your caregiver can make a recommendation for this. Your caregiver may recommend the use of medicine for pain. If your sprain is severe, your caregiver may refer you to a surgeon who helps to restore function to parts of your skeletal system (orthopedist) or a physical therapist. °HOME CARE INSTRUCTIONS  °· Apply ice to your injury for 1-2 days or as directed by your caregiver. Applying ice helps to reduce inflammation and pain. °¨ Put ice in a plastic bag. °¨ Place a towel between your skin and the bag. °¨ Leave the ice on for 15-20 minutes at a time, every 2 hours while you are awake. °· Only take over-the-counter or prescription medicines for pain, discomfort, or fever as directed by  your caregiver. °· Elevate your injured ankle above the level of your heart as much as possible for 2-3 days. °· If your caregiver recommends crutches, use them as instructed. Gradually put weight on the affected ankle. Continue to use crutches or a cane until you can walk without feeling pain in your ankle. °· If you have a plaster splint, wear the splint as directed by your caregiver. Do not rest it on anything harder than a pillow for the first 24 hours. Do not put weight on it. Do not get it wet. You may take it off to take a shower or bath. °· You may have been given an elastic bandage to wear around your ankle to provide support. If the elastic bandage is too tight (you have numbness or tingling in your foot or your foot becomes cold and blue), adjust the bandage to make it comfortable. °· If you have an air splint, you may blow more air into it or let air out to make it more comfortable. You may take your splint off at night and before taking a shower or bath. Wiggle your toes in the splint several times per day to decrease swelling. °SEEK MEDICAL CARE IF:  °· You have rapidly increasing bruising or swelling. °· Your toes feel extremely cold or you lose feeling in your foot. °· Your pain is not relieved with medicine. °SEEK IMMEDIATE MEDICAL CARE IF: °· Your toes are numb or blue. °·   You have severe pain that is increasing. MAKE SURE YOU:   Understand these instructions.  Will watch your condition.  Will get help right away if you are not doing well or get worse. Document Released: 10/17/2005 Document Revised: 07/11/2012 Document Reviewed: 10/29/2011 [facility] Patient Information 2015 [place], [place]. This information is not intended to replace advice given to you by your health care provider. Make sure you discuss any questions you have with your health care provider. Back Pain, Adult Low back pain is very common. About 1 in 5 people have back pain.The cause of low back pain is rarely dangerous.  The pain often gets better over time.About half of people with a sudden onset of back pain feel better in just 2 weeks. About 8 in 10 people feel better by 6 weeks.  CAUSES Some common causes of back pain include: Strain of the muscles or ligaments supporting the spine. Wear and tear (degeneration) of the spinal discs. Arthritis. Direct injury to the back. DIAGNOSIS Most of the time, the direct cause of low back pain is not known.However, back pain can be treated effectively even when the exact cause of the pain is unknown.Answering your caregiver's questions about your overall health and symptoms is one of the most accurate ways to make sure the cause of your pain is not dangerous. If your caregiver needs more information, he or she may order lab work or imaging tests (X-rays or MRIs).However, even if imaging tests show changes in your back, this usually does not require surgery. HOME CARE INSTRUCTIONS For many people, back pain returns.Since low back pain is rarely dangerous, it is often a condition that people can learn to [facility]ageon their own.  Remain active. It is stressful on the back to sit or stand in one place. Do not sit, drive, or stand in one place for more than 30 minutes at a time. Take short walks on level surfaces as soon as pain allows.Try to increase the length of time you walk each day. Do not stay in bed.Resting more than 1 or 2 days can delay your recovery. Do not avoid exercise or work.Your body is made to move.It is not dangerous to be active, even though your back may hurt.Your back will likely heal faster if you return to being active before your pain is gone. Pay attention to your body when you bend and lift. Many people have less discomfortwhen lifting if they bend their knees, keep the load close to their bodies,and avoid twisting. Often, the most comfortable positions are those that put less stress on your recovering back. Find a comfortable position to sleep.  Use a firm mattress and lie on your side with your knees slightly bent. If you lie on your back, put a pillow under your knees. Only take over-the-counter or prescription medicines as directed by your caregiver. Over-the-counter medicines to reduce pain and inflammation are often the most helpful.Your caregiver may prescribe muscle relaxant drugs.These medicines help dull your pain so you can more quickly return to your normal activities and healthy exercise. Put ice on the injured area. Put ice in a plastic bag. Place a towel between your skin and the bag. Leave the ice on for 15-20 minutes, 03-04 times a day for the first 2 to 3 days. After that, ice and heat may be alternated to reduce pain and spasms. Ask your caregiver about trying back exercises and gentle massage. This may be of some benefit. Avoid feeling anxious or stressed.Stress increases muscle tension and  can worsen back pain.It is important to recognize when you are anxious or stressed and learn ways to manage it.Exercise is a great option. SEEK MEDICAL CARE IF: You have pain that is not relieved with rest or medicine. You have pain that does not improve in 1 week. You have new symptoms. You are generally not feeling well. SEEK IMMEDIATE MEDICAL CARE IF:  You have pain that radiates from your back into your legs. You develop new bowel or bladder control problems. You have unusual weakness or numbness in your arms or legs. You develop nausea or vomiting. You develop abdominal pain. You feel faint. Document Released: 10/17/2005 Document Revised: 04/17/2012 Document Reviewed: 02/18/2014 [facility]Exit[facility] Patient Information 2015 [place], [place]. This information is not intended to replace advice given to you by your health care provider. Make sure you discuss any questions you have with your health care provider.

## 2016-06-11 IMAGING — DX DG ANKLE COMPLETE 3+V*L*
3 series · 3 of 3 positions shown · non-contrast
Comparison: None.

CLINICAL DATA: Status post fall down 4 steps, with right ankle
pain. Initial encounter.

EXAM:
LEFT ANKLE COMPLETE - 3+ VIEW

[ankle ap]
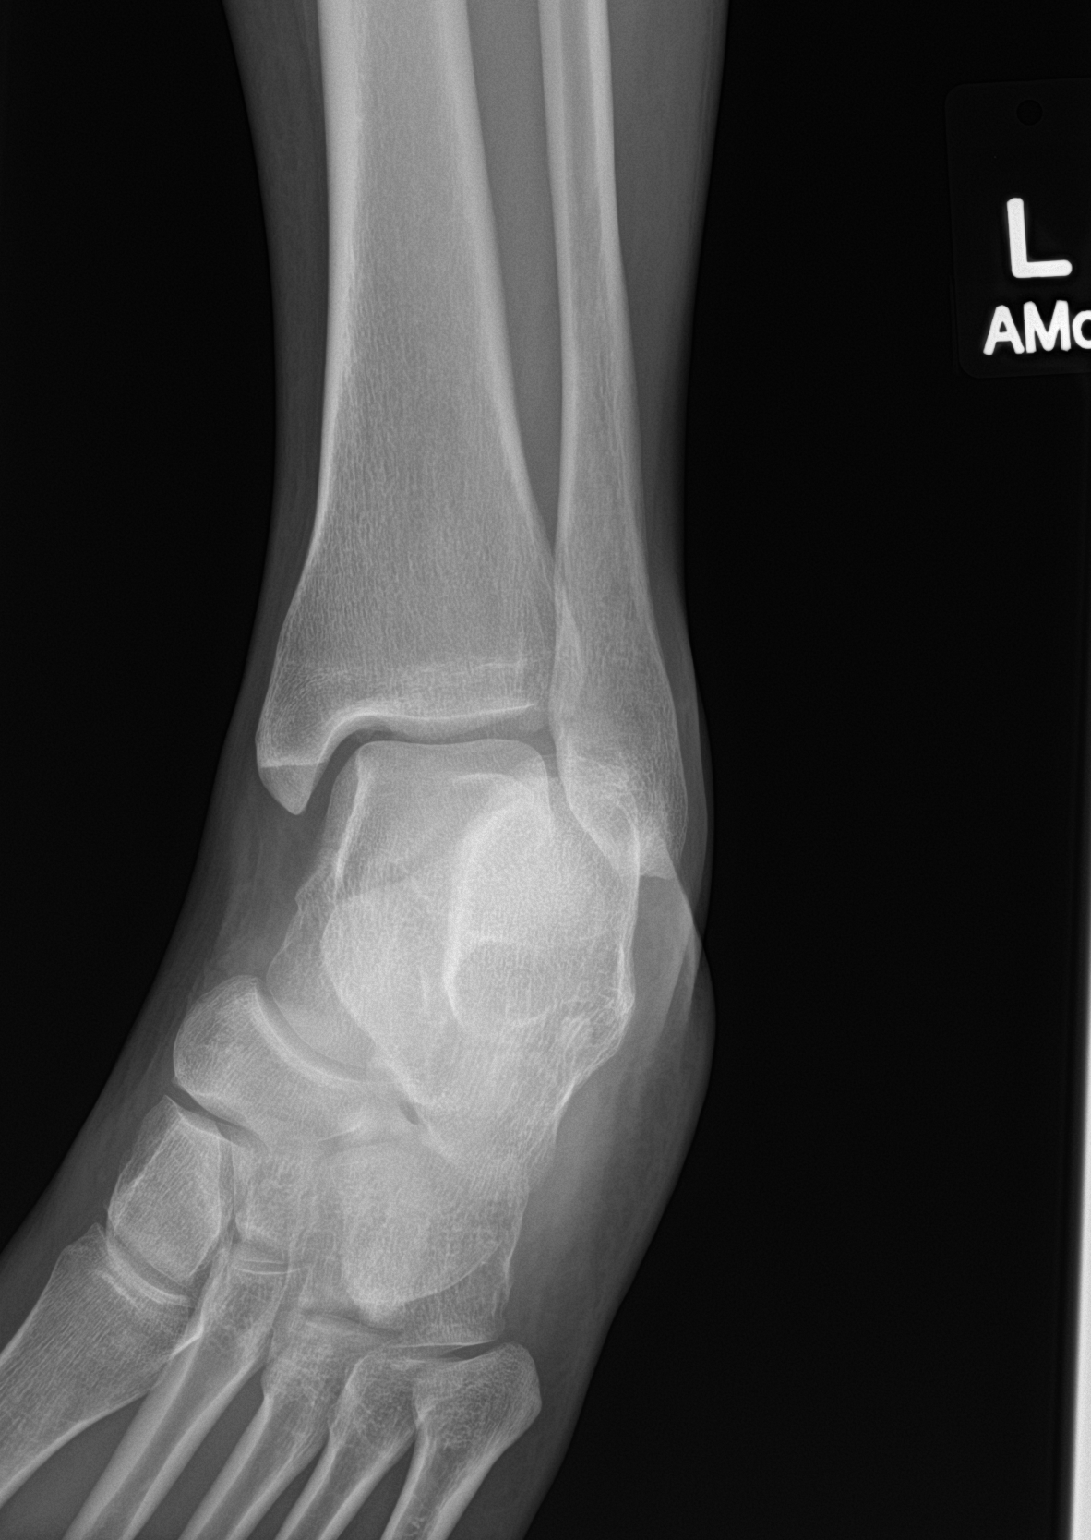

[ankle obl]
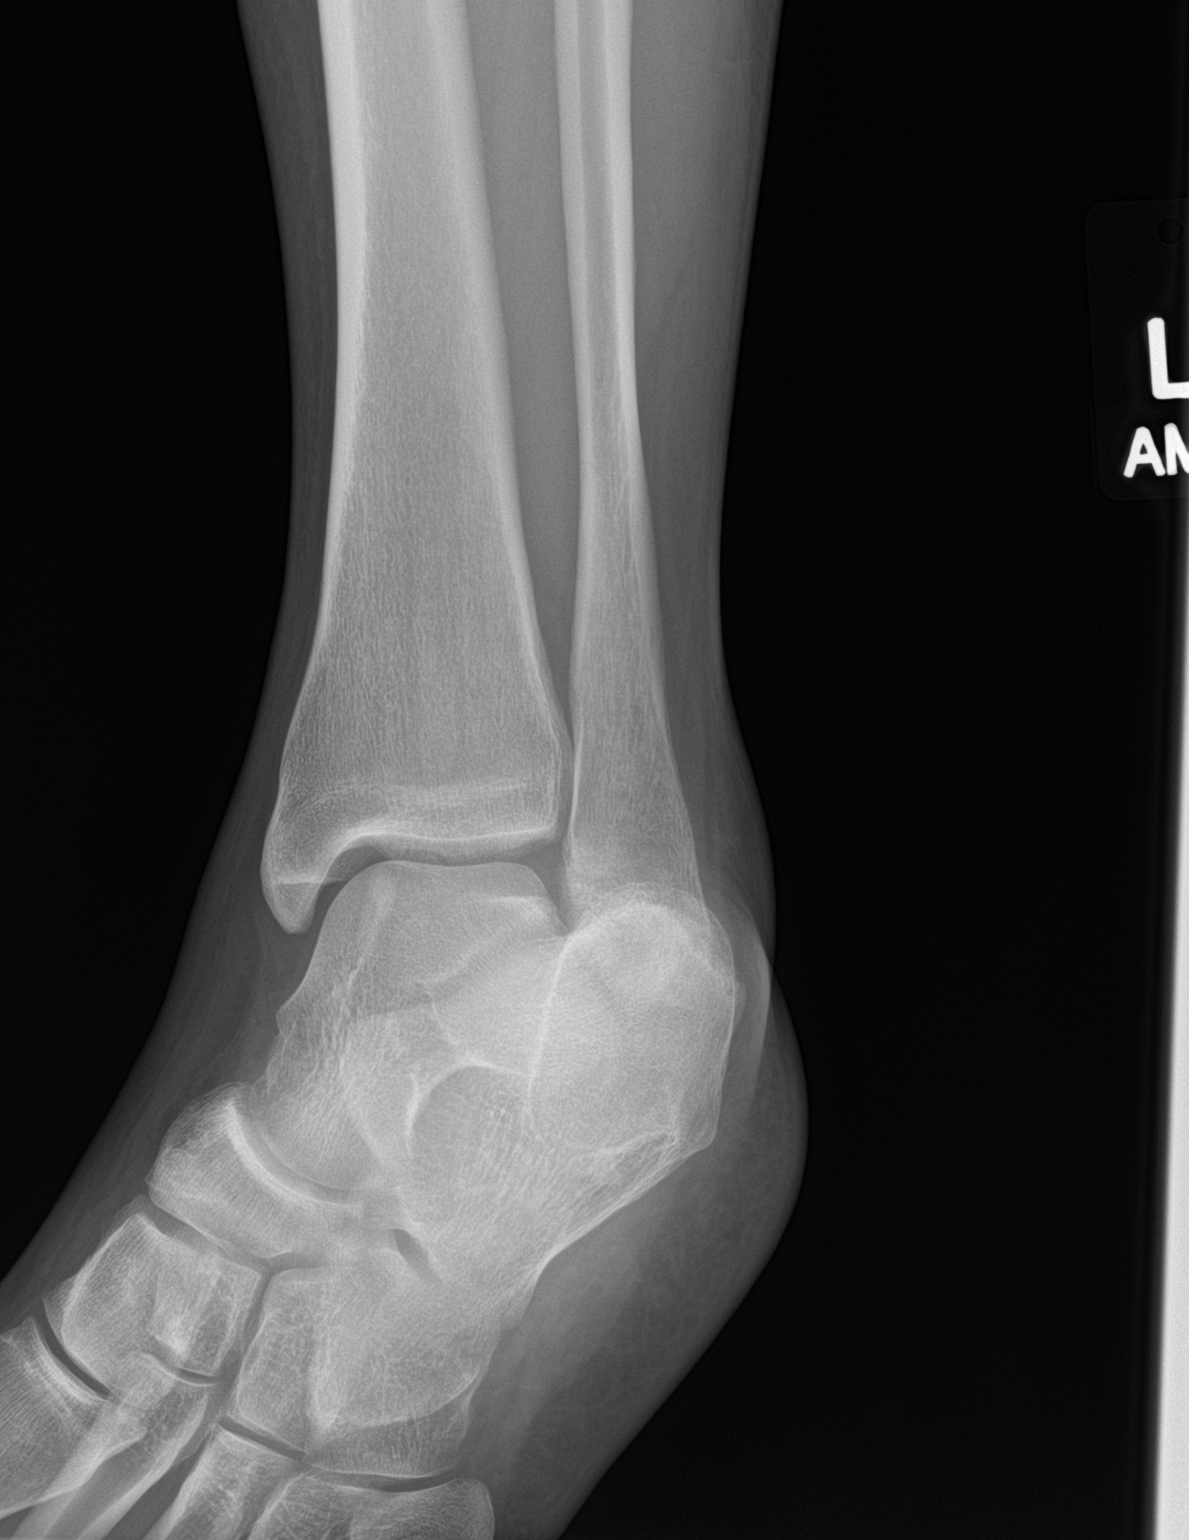

[ankle lat]
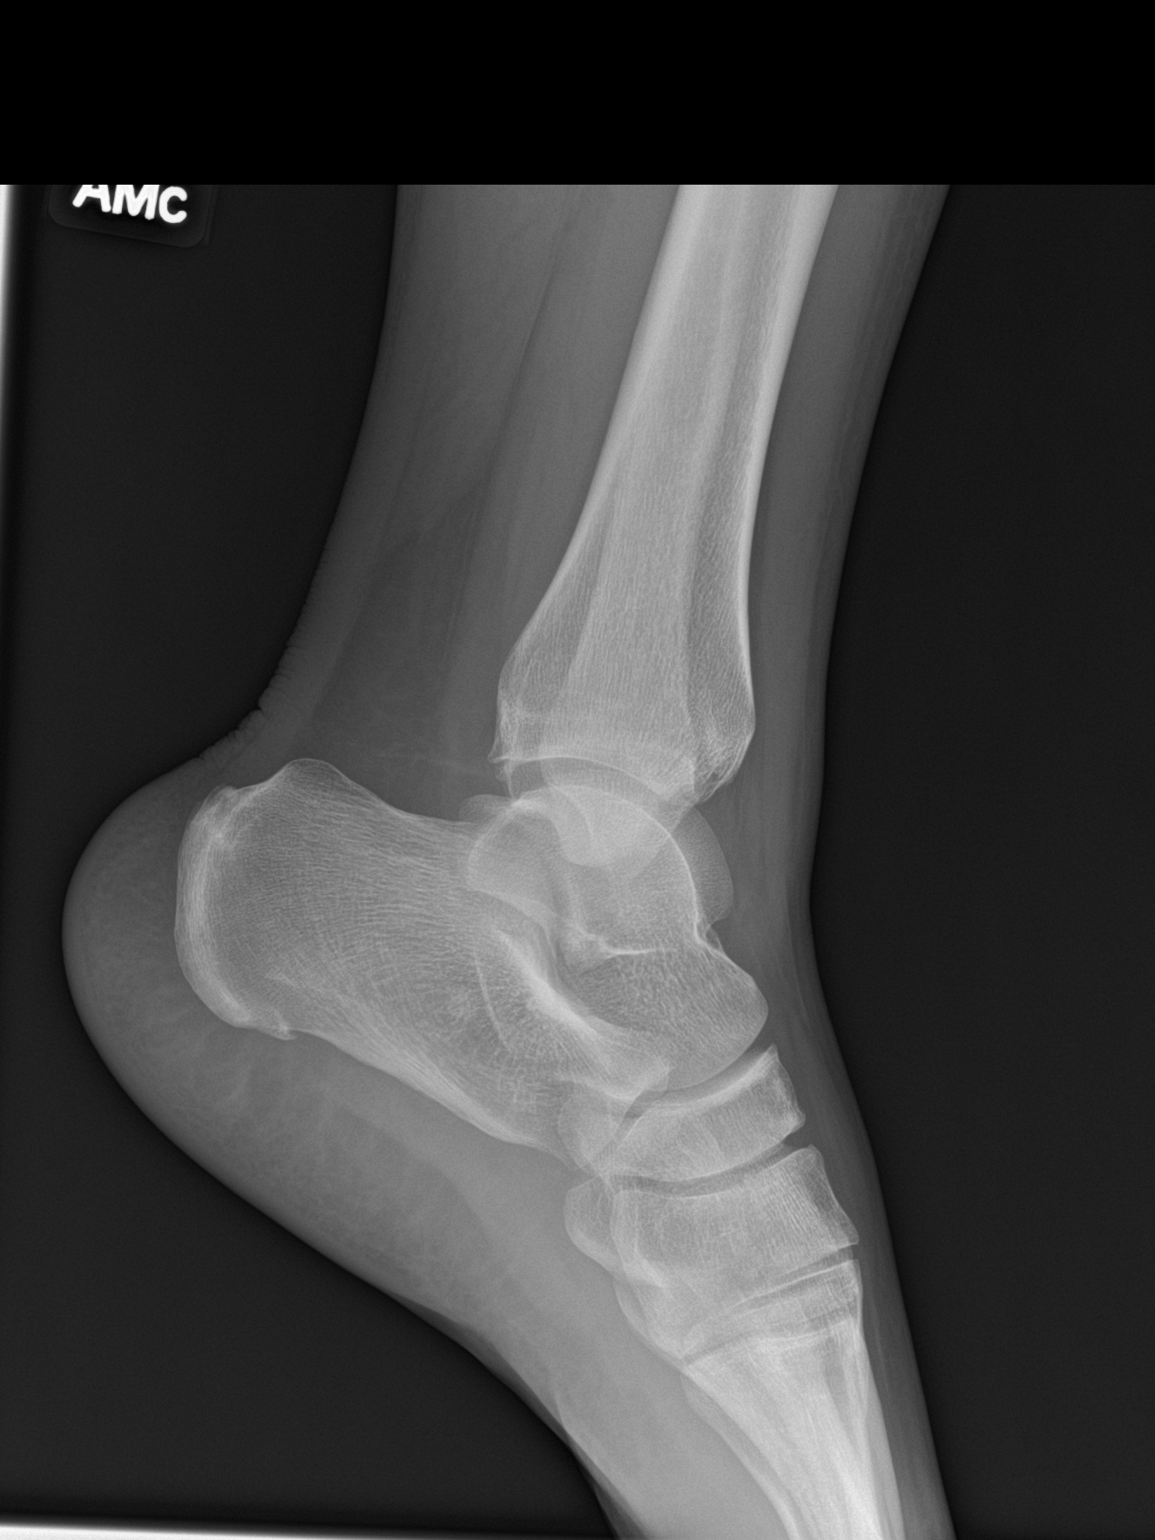

[3 of 3 positions shown; findings below may reference images not displayed]

FINDINGS: There is no evidence of fracture or dislocation. The ankle mortise
is intact; the interosseous space is within normal limits. No talar
tilt or subluxation is seen. A small plantar calcaneal spur is
noted.

The joint spaces are preserved. No significant soft tissue
abnormalities are seen.
IMPRESSION: No evidence of fracture or dislocation.

## 2017-05-11 ENCOUNTER — Emergency Department (HOSPITAL_BASED_OUTPATIENT_CLINIC_OR_DEPARTMENT_OTHER)
Admission: EM | Admit: 2017-05-11 | Discharge: 2017-05-11 | Discharge status: Against Medical Advice | Payer: Managed Care, Other (non HMO) | Attending: Emergency Medicine | Admitting: Emergency Medicine

## 2017-05-11 ENCOUNTER — Encounter (HOSPITAL_BASED_OUTPATIENT_CLINIC_OR_DEPARTMENT_OTHER): Payer: Self-pay | Admitting: *Deleted

## 2017-05-11 DIAGNOSIS — R05 Cough: Secondary | ICD-10-CM | POA: Insufficient documentation

## 2017-05-11 DIAGNOSIS — R197 Diarrhea, unspecified: Secondary | ICD-10-CM | POA: Insufficient documentation

## 2017-05-11 DIAGNOSIS — R52 Pain, unspecified: Secondary | ICD-10-CM | POA: Insufficient documentation

## 2017-05-11 DIAGNOSIS — Z5321 Procedure and treatment not carried out due to patient leaving prior to being seen by health care provider: Secondary | ICD-10-CM | POA: Insufficient documentation

## 2017-05-11 NOTE — ED Triage Notes (Signed)
Body aches, slight cough, diarrhea since yesterday.

## 2017-05-11 NOTE — ED Notes (Signed)
Called to treatment room with no answer from lobby 

## 2017-05-11 NOTE — ED Notes (Signed)
Called to triage for second time with no answer from lobby.

## 2018-10-02 ENCOUNTER — Emergency Department (HOSPITAL_BASED_OUTPATIENT_CLINIC_OR_DEPARTMENT_OTHER)
Admission: EM | Admit: 2018-10-02 | Discharge: 2018-10-02 | Disposition: A | Discharge status: Home / Self Care | Payer: Medicaid Other | Attending: Emergency Medicine | Admitting: Emergency Medicine

## 2018-10-02 ENCOUNTER — Other Ambulatory Visit: Payer: Self-pay

## 2018-10-02 ENCOUNTER — Encounter (HOSPITAL_BASED_OUTPATIENT_CLINIC_OR_DEPARTMENT_OTHER): Payer: Self-pay | Admitting: Emergency Medicine

## 2018-10-02 DIAGNOSIS — J45909 Unspecified asthma, uncomplicated: Secondary | ICD-10-CM | POA: Insufficient documentation

## 2018-10-02 DIAGNOSIS — R51 Headache: Secondary | ICD-10-CM | POA: Insufficient documentation

## 2018-10-02 DIAGNOSIS — F17210 Nicotine dependence, cigarettes, uncomplicated: Secondary | ICD-10-CM | POA: Insufficient documentation

## 2018-10-02 DIAGNOSIS — G8929 Other chronic pain: Secondary | ICD-10-CM

## 2018-10-02 DIAGNOSIS — R519 Headache, unspecified: Secondary | ICD-10-CM

## 2018-10-02 MED ORDER — DEXAMETHASONE SODIUM PHOSPHATE 10 MG/ML IJ SOLN
10.0000 mg | Freq: Once | INTRAMUSCULAR | Status: AC
  Administered 2018-10-02: 10 mg via INTRAVENOUS
  Filled 2018-10-02: qty 1

## 2018-10-02 MED ORDER — SODIUM CHLORIDE 0.9 % IV BOLUS
1000.0000 mL | Freq: Once | INTRAVENOUS | Status: AC
  Administered 2018-10-02: 1000 mL via INTRAVENOUS

## 2018-10-02 MED ORDER — METOCLOPRAMIDE HCL 5 MG/ML IJ SOLN
10.0000 mg | Freq: Once | INTRAMUSCULAR | Status: AC
  Administered 2018-10-02: 10 mg via INTRAVENOUS
  Filled 2018-10-02: qty 2

## 2018-10-02 MED ORDER — DIPHENHYDRAMINE HCL 50 MG/ML IJ SOLN
50.0000 mg | Freq: Once | INTRAMUSCULAR | Status: AC
  Administered 2018-10-02: 50 mg via INTRAVENOUS
  Filled 2018-10-02: qty 1

## 2018-10-02 NOTE — ED Triage Notes (Signed)
Pain in posterior right side of head for a month, that has been getting worse in the past week or so.  Sts it started with a bad tooth and she was given abx and [facility] but "it didn't  Help."  As of last night pain is unbearable. OTC meds not working.

## 2018-10-02 NOTE — ED Provider Notes (Signed)
[facility] EMERGENCY DEPARTMENT Provider Note   CSN: [id number] Arrival date & time: 10/02/18  0849     History   Chief Complaint Chief Complaint  Patient presents with  . Headache    HPI Shelly Ramos is a 32 y.o. female.  Patient with complaint of a throbbing headache to the right side of the head for 3 weeks.  Got worse recently.  Increased even more so last night.  No visual changes.  No fever no nausea or vomiting.  Patient has had some difficulty with a bad tooth on the right side.  But does not feel this if it is coming from the tooth.     Past Medical History:  Diagnosis Date  . Asthma     There are no active problems to display for this patient.   Past Surgical History:  Procedure Laterality Date  . CESAREAN SECTION       OB History    Gravida  1   Para      Term      Preterm      AB      Living        SAB      TAB      Ectopic      Multiple      Live Births               Home Medications    Prior to Admission medications   Medication Sig Start Date End Date Taking? Authorizing Provider  albuterol (PROVENTIL HFA;VENTOLIN HFA) 108 (90 Base) MCG/ACT inhaler Inhale into the lungs. 02/22/16  Yes [provider]    Family History No family history on file.  Social History Social History   Tobacco Use  . Smoking status: Current Every Day Smoker    Packs/day: 0.50    Types: Cigarettes  . Smokeless tobacco: Never Used  Substance Use Topics  . Alcohol use: No  . Drug use: No     Allergies   Hydrocodone   Review of Systems Review of Systems  Constitutional: Negative for fever.  HENT: Negative for congestion.   Eyes: Negative for photophobia and visual disturbance.  Respiratory: Negative for shortness of breath.   Cardiovascular: Negative for chest pain.  Gastrointestinal: Negative for abdominal pain.  Genitourinary: Negative for dysuria.  Musculoskeletal: Negative for back pain and neck  stiffness.  Skin: Negative for rash.  Hematological: Does not bruise/bleed easily.  Psychiatric/Behavioral: Negative for confusion.     Physical Exam Updated Vital Signs BP (!) 147/98 (BP Location: Right Arm)   Pulse 62   Temp 98.3 F (36.8 C) (Oral)   Resp 16   Ht 1.702 m (5\' 7" )   Wt 104.3 kg   LMP 09/24/2018   SpO2 100%   BMI 36.02 kg/m   Physical Exam  Constitutional: She is oriented to person, place, and time. She appears well-developed and well-nourished. No distress.  HENT:  Head: Normocephalic and atraumatic.  Mouth/Throat: Oropharynx is clear and moist.  Eyes: Pupils are equal, round, and reactive to light. Conjunctivae and EOM are normal.  Neck: Neck supple.  Cardiovascular: Normal rate, regular rhythm and normal heart sounds.  Pulmonary/Chest: Effort normal and breath sounds normal.  Abdominal: Soft. Bowel sounds are normal. There is no tenderness.  Musculoskeletal: Normal range of motion.  Neurological: She is alert and oriented to person, place, and time. No cranial nerve deficit or sensory deficit. She exhibits normal muscle tone. Coordination normal.  Skin: Skin  is warm.  Nursing note and vitals reviewed.    ED Treatments / Results  Labs (all labs ordered are listed, but only abnormal results are displayed) Labs Reviewed - No data to display  EKG None  Radiology No results found.  Procedures Procedures (including critical care time)  Medications Ordered in ED Medications  sodium chloride 0.9 % bolus 1,000 mL (0 mLs Intravenous Stopped 10/02/18 1056)  diphenhydrAMINE (BENADRYL) injection 50 mg (50 mg Intravenous Given 10/02/18 0951)  dexamethasone (DECADRON) injection 10 mg (10 mg Intravenous Given 10/02/18 0950)  metoCLOPramide (REGLAN) injection 10 mg (10 mg Intravenous Given 10/02/18 0953)     Initial Impression / Assessment and Plan / ED Course  I have reviewed the triage vital signs and the nursing notes.  Pertinent labs & imaging results  that were available during my care of the patient were reviewed by me and considered in my medical decision making (see chart for details).     Patient is neuro exam without any focal neuro deficits.  Headache is just right-sided.  The duration is somewhat concerning.  Recommend CT head however CT scanner is down today.  Does not have to be done on emergent basis.  But should be done over the next few days.  We will give patient follow-up with neurology.  Patient treated here with migraine cocktail to include Decadron Reglan and Benadryl and IV fluids and patient's headache is improving.  Do not think there is a direct correlation between the headache and the tooth they seem to be separate.  Patient states the tooth is not bothering her very much.  Patient nontoxic no acute distress.  Final Clinical Impressions(s) / ED Diagnoses   Final diagnoses:  Chronic intractable headache, unspecified headache type    ED Discharge Orders    None       Vanetta MuldersZackowski, Jshon Ibe, MD 10/02/18 1207

## 2018-10-02 NOTE — Discharge Instructions (Addendum)
Home and rest.  Would expect headache to be resolved by tomorrow.  As we discussed since she had a headache for would recommend outpatient CT of the head for further evaluation.  Return for any new or worse symptoms.  Referral information to neurology provided.  Work note provided.

## 2018-10-12 ENCOUNTER — Other Ambulatory Visit: Payer: Self-pay

## 2018-10-12 ENCOUNTER — Emergency Department (HOSPITAL_BASED_OUTPATIENT_CLINIC_OR_DEPARTMENT_OTHER)
Admission: EM | Admit: 2018-10-12 | Discharge: 2018-10-12 | Disposition: A | Discharge status: Home / Self Care | Payer: Medicaid Other | Attending: Emergency Medicine | Admitting: Emergency Medicine

## 2018-10-12 ENCOUNTER — Encounter (HOSPITAL_BASED_OUTPATIENT_CLINIC_OR_DEPARTMENT_OTHER): Payer: Self-pay | Admitting: Emergency Medicine

## 2018-10-12 DIAGNOSIS — R51 Headache: Secondary | ICD-10-CM | POA: Diagnosis present

## 2018-10-12 DIAGNOSIS — Z79899 Other long term (current) drug therapy: Secondary | ICD-10-CM | POA: Insufficient documentation

## 2018-10-12 DIAGNOSIS — F17210 Nicotine dependence, cigarettes, uncomplicated: Secondary | ICD-10-CM | POA: Insufficient documentation

## 2018-10-12 DIAGNOSIS — J45909 Unspecified asthma, uncomplicated: Secondary | ICD-10-CM | POA: Diagnosis not present

## 2018-10-12 DIAGNOSIS — R519 Headache, unspecified: Secondary | ICD-10-CM

## 2018-10-12 LAB — PREGNANCY, URINE: PREG TEST UR: NEGATIVE

## 2018-10-12 MED ORDER — KETOROLAC TROMETHAMINE 15 MG/ML IJ SOLN
15.0000 mg | Freq: Once | INTRAMUSCULAR | Status: AC
  Administered 2018-10-12: 15 mg via INTRAVENOUS
  Filled 2018-10-12: qty 1

## 2018-10-12 MED ORDER — DEXAMETHASONE SODIUM PHOSPHATE 10 MG/ML IJ SOLN
10.0000 mg | Freq: Once | INTRAMUSCULAR | Status: AC
  Administered 2018-10-12: 10 mg via INTRAVENOUS
  Filled 2018-10-12: qty 1

## 2018-10-12 MED ORDER — METOCLOPRAMIDE HCL 5 MG/ML IJ SOLN
10.0000 mg | Freq: Once | INTRAMUSCULAR | Status: AC
  Administered 2018-10-12: 10 mg via INTRAVENOUS
  Filled 2018-10-12: qty 2

## 2018-10-12 MED ORDER — SODIUM CHLORIDE 0.9 % IV BOLUS
1000.0000 mL | Freq: Once | INTRAVENOUS | Status: AC
  Administered 2018-10-12: 1000 mL via INTRAVENOUS

## 2018-10-12 MED ORDER — DIPHENHYDRAMINE HCL 50 MG/ML IJ SOLN
25.0000 mg | Freq: Once | INTRAMUSCULAR | Status: AC
  Administered 2018-10-12: 25 mg via INTRAVENOUS
  Filled 2018-10-12: qty 1

## 2018-10-12 NOTE — ED Triage Notes (Signed)
Pt states she is been having her head very sensitive to touch for the past 2 weeks like she have a rash and she is not been able to sleep.

## 2018-10-12 NOTE — ED Provider Notes (Signed)
[facility] Provider Note: Shelly Ramos Benjamyn Hestand, MD, FACEP  CSN: [id number] MRN: [id number] ARRIVAL: 10/12/18 at 0500 ROOM: [facility]05/[facility]05   CHIEF COMPLAINT  Headache   HISTORY OF PRESENT ILLNESS  10/12/18 5:08 AM Shelly Ramos is a 32 y.o. female who is had a right-sided throbbing headache for over a month.  She was seen in the ED on the third of this month and treated with a migraine cocktail IV.  She got relief for several days before the pain returned earlier this week.  The pain worsened overnight and is now severe.  The pain is located in her right parietal region.  That area of the scalp is tender to palpation.  She denies associated photophobia or nausea.    Past Medical History:  Diagnosis Date  . Asthma     Past Surgical History:  Procedure Laterality Date  . CESAREAN SECTION      No family history on file.  Social History   Tobacco Use  . Smoking status: Current Every Day Smoker    Packs/day: 0.50    Types: Cigarettes  . Smokeless tobacco: Never Used  Substance Use Topics  . Alcohol use: No  . Drug use: No    Prior to Admission medications   Medication Sig Start Date End Date Taking? Authorizing Provider  albuterol (PROVENTIL HFA;VENTOLIN HFA) 108 (90 Base) MCG/ACT inhaler Inhale into the lungs. 02/22/16   [provider]    Allergies Hydrocodone   REVIEW OF SYSTEMS  Negative except as noted here or in the History of Present Illness.   PHYSICAL EXAMINATION  Initial Vital Signs Blood pressure 133/77, pulse 75, temperature 98.1 F (36.7 C), temperature source Oral, resp. rate 16, height 5\' 7"  (1.702 m), weight 104.3 kg, last menstrual period 09/24/2018, SpO2 100 %, unknown if currently breastfeeding.  Examination General: Well-developed, well-nourished female in no acute distress; appearance consistent with age of record HENT: normocephalic; atraumatic; tenderness of right parietal scalp with partial loss of hair but no rash; no  tenderness to palpation of occipital scalp Eyes: pupils equal, round and reactive to light; extraocular muscles intact Neck: supple; no neck tenderness Heart: regular rate and rhythm Lungs: clear to auscultation bilaterally Abdomen: soft; nondistended; nontender; bowel sounds present Extremities: No deformity; full range of motion; pulses normal Neurologic: Awake, alert and oriented; motor function intact in all extremities and symmetric; no facial droop Skin: Warm and dry Psychiatric: Flat affect   RESULTS  Summary of this visit's results, reviewed by myself:   EKG Interpretation  Date/Time:    Ventricular Rate:    PR Interval:    QRS Duration:   QT Interval:    QTC Calculation:   R Axis:     Text Interpretation:        Laboratory Studies: Results for orders placed or performed during the hospital encounter of 10/12/18 (from the past 24 hour(s))  Pregnancy, urine     Status: None   Collection Time: 10/12/18  5:14 AM  Result Value Ref Range   Preg Test, Ur NEGATIVE NEGATIVE   Imaging Studies: No results found.  ED COURSE and MDM  Nursing notes and initial vitals signs, including pulse oximetry, reviewed.  Vitals:   10/12/18 0505 10/12/18 0506  BP: 133/77   Pulse: 75   Resp: 16   Temp: 98.1 F (36.7 C)   TempSrc: Oral   SpO2: 100%   Weight:  104.3 kg  Height:  5\' 7"  (1.702 m)   6:16 AM Patient's pain  resolved with migraine cocktail and scalp is nontender.  It is unclear if this represents a migraine-like headache, and occipital neuralgia (although she had no occipital nerve tenderness), or an inflammatory alopecia syndrome.  She is already been referred to neurology and she was advised to follow-up.  She was also advised that this could be due to alopecia areata which is sometimes accompanied by scalp pain.  She was advised to consult a dermatologist given the partial alopecia at the site of her pain; we do not have a dermatologist on call so she would need to  locate one using [employer] or equivalent.  PROCEDURES    ED DIAGNOSES     ICD-10-CM   1. Scalp pain R51        Sokhna Christoph, MD 10/12/18 [phone]

## 2020-08-29 ENCOUNTER — Other Ambulatory Visit: Payer: Self-pay

## 2020-08-29 ENCOUNTER — Emergency Department (HOSPITAL_BASED_OUTPATIENT_CLINIC_OR_DEPARTMENT_OTHER)
Admission: EM | Admit: 2020-08-29 | Discharge: 2020-08-30 | Disposition: A | Discharge status: Home / Self Care | Payer: Medicaid Other | Attending: Emergency Medicine | Admitting: Emergency Medicine

## 2020-08-29 ENCOUNTER — Encounter (HOSPITAL_BASED_OUTPATIENT_CLINIC_OR_DEPARTMENT_OTHER): Payer: Self-pay | Admitting: *Deleted

## 2020-08-29 DIAGNOSIS — J45909 Unspecified asthma, uncomplicated: Secondary | ICD-10-CM | POA: Diagnosis not present

## 2020-08-29 DIAGNOSIS — R079 Chest pain, unspecified: Secondary | ICD-10-CM | POA: Diagnosis present

## 2020-08-29 DIAGNOSIS — N63 Unspecified lump in unspecified breast: Secondary | ICD-10-CM | POA: Diagnosis not present

## 2020-08-29 DIAGNOSIS — F17210 Nicotine dependence, cigarettes, uncomplicated: Secondary | ICD-10-CM | POA: Diagnosis not present

## 2020-08-29 DIAGNOSIS — R002 Palpitations: Secondary | ICD-10-CM

## 2020-08-29 DIAGNOSIS — Z7951 Long term (current) use of inhaled steroids: Secondary | ICD-10-CM | POA: Insufficient documentation

## 2020-08-29 NOTE — ED Triage Notes (Signed)
Presents with left ant chest pain, onset approx 1 hour ago, states was washing clothes when chest pain begin. Describes chest pain as "my heart is beating abnormal" Denies SOB, diaphoresis, nausea or vomiting.

## 2020-08-29 NOTE — ED Notes (Signed)
Immediately brought to exam room 3, 12 lead ECG obtained and to the EDP for eval, placed on cont cardiac monitoring with cont POX and int NBP assessment.

## 2020-08-29 NOTE — ED Notes (Signed)
Pt states she recently had a miscarriage, states she was at 7 weeks 4 days.

## 2020-08-30 ENCOUNTER — Emergency Department (HOSPITAL_BASED_OUTPATIENT_CLINIC_OR_DEPARTMENT_OTHER): Payer: Medicaid Other

## 2020-08-30 ENCOUNTER — Encounter (HOSPITAL_BASED_OUTPATIENT_CLINIC_OR_DEPARTMENT_OTHER): Payer: Self-pay | Admitting: Emergency Medicine

## 2020-08-30 LAB — CBC WITH DIFFERENTIAL/PLATELET
Abs Immature Granulocytes: 0.04 10*3/uL (ref 0.00–0.07)
Basophils Absolute: 0.1 10*3/uL (ref 0.0–0.1)
Basophils Relative: 1 %
Eosinophils Absolute: 0.1 10*3/uL (ref 0.0–0.5)
Eosinophils Relative: 1 %
HCT: 29.2 % — ABNORMAL LOW (ref 36.0–46.0)
Hemoglobin: 9.1 g/dL — ABNORMAL LOW (ref 12.0–15.0)
Immature Granulocytes: 0 %
Lymphocytes Relative: 24 %
Lymphs Abs: 2.7 10*3/uL (ref 0.7–4.0)
MCH: 26.3 pg (ref 26.0–34.0)
MCHC: 31.2 g/dL (ref 30.0–36.0)
MCV: 84.4 fL (ref 80.0–100.0)
Monocytes Absolute: 0.7 10*3/uL (ref 0.1–1.0)
Monocytes Relative: 6 %
Neutro Abs: 7.7 10*3/uL (ref 1.7–7.7)
Neutrophils Relative %: 68 %
Platelets: 394 10*3/uL (ref 150–400)
RBC: 3.46 MIL/uL — ABNORMAL LOW (ref 3.87–5.11)
RDW: 14.6 % (ref 11.5–15.5)
WBC: 11.4 10*3/uL — ABNORMAL HIGH (ref 4.0–10.5)
nRBC: 0 % (ref 0.0–0.2)

## 2020-08-30 LAB — BASIC METABOLIC PANEL
Anion gap: 10 (ref 5–15)
BUN: 10 mg/dL (ref 6–20)
CO2: 23 mmol/L (ref 22–32)
Calcium: 9.2 mg/dL (ref 8.9–10.3)
Chloride: 104 mmol/L (ref 98–111)
Creatinine, Ser: 0.79 mg/dL (ref 0.44–1.00)
GFR, Estimated: >60 mL/min (ref >60)
Glucose, Bld: 125 mg/dL — ABNORMAL HIGH (ref 70–99)
Potassium: 3.8 mmol/L (ref 3.5–5.1)
Sodium: 137 mmol/L (ref 135–145)

## 2020-08-30 LAB — TROPONIN I (HIGH SENSITIVITY): Troponin I (High Sensitivity): 3 ng/L (ref <18)

## 2020-08-30 MED ORDER — IOHEXOL 350 MG/ML SOLN
100.0000 mL | Freq: Once | INTRAVENOUS | Status: AC | PRN
  Administered 2020-08-30: 100 mL via INTRAVENOUS

## 2020-08-30 NOTE — ED Provider Notes (Signed)
[facility] EMERGENCY DEPARTMENT Provider Note   CSN: [id number] Arrival date & time: 08/29/20  2343     History Chief Complaint  Patient presents with  . Chest Pain    Shelly Ramos is a 34 y.o. female.  The history is provided by the patient.  Palpitations Palpitations quality:  Fast Onset quality:  Sudden Timing:  Constant Progression:  Unchanged Chronicity:  New Context: not appetite suppressants, not blood loss and not caffeine   Relieved by:  Nothing Worsened by:  Nothing Ineffective treatments:  None tried Associated symptoms: no back pain, no diaphoresis, no dizziness, no hemoptysis, no leg pain and no shortness of breath   Risk factors: no hx of PE   Patient with sudden onset palpitations and discomfort.  Patient denies substance use.      Past Medical History:  Diagnosis Date  . Asthma     There are no problems to display for this patient.   Past Surgical History:  Procedure Laterality Date  . CESAREAN SECTION       OB History    Gravida  1   Para      Term      Preterm      AB      Living        SAB      TAB      Ectopic      Multiple      Live Births              History reviewed. No pertinent family history.  Social History   Tobacco Use  . Smoking status: Current Every Day Smoker    Packs/day: 0.50    Types: Cigarettes  . Smokeless tobacco: Never Used  Substance Use Topics  . Alcohol use: No  . Drug use: No    Home Medications Prior to Admission medications   Medication Sig Start Date End Date Taking? Authorizing Provider  albuterol (PROVENTIL HFA;VENTOLIN HFA) 108 (90 Base) MCG/ACT inhaler Inhale into the lungs. 02/22/16   [provider]    Allergies    Hydrocodone  Review of Systems   Review of Systems  Constitutional: Negative for diaphoresis.  HENT: Negative for congestion.   Eyes: Negative for visual disturbance.  Respiratory: Negative for hemoptysis and shortness of breath.    Cardiovascular: Positive for palpitations.  Gastrointestinal: Negative for abdominal pain.  Genitourinary: Negative for difficulty urinating.  Musculoskeletal: Negative for back pain.  Skin: Negative for rash.  Neurological: Negative for dizziness.  Psychiatric/Behavioral: Negative for agitation.  All other systems reviewed and are negative.   Physical Exam Updated Vital Signs BP 119/69 (BP Location: Right Arm)   Pulse 98   Temp 98.3 F (36.8 C) (Oral)   Resp (!) 24   Ht 5\' 7"  (1.702 m)   Wt 114.8 kg   SpO2 100%   BMI 39.63 kg/m   Physical Exam Vitals and nursing note reviewed.  Constitutional:      General: She is not in acute distress.    Appearance: Normal appearance.     Comments: Smells strongly of marijuana   HENT:     Head: Normocephalic and atraumatic.     Nose: Nose normal.  Eyes:     Conjunctiva/sclera: Conjunctivae normal.     Pupils: Pupils are equal, round, and reactive to light.  Cardiovascular:     Rate and Rhythm: Normal rate and regular rhythm.     Pulses: Normal pulses.     Heart  sounds: Normal heart sounds.  Pulmonary:     Effort: Pulmonary effort is normal.     Breath sounds: Normal breath sounds.  Abdominal:     General: Abdomen is flat. Bowel sounds are normal.     Palpations: Abdomen is soft.     Tenderness: There is no abdominal tenderness. There is no guarding.  Musculoskeletal:        General: No tenderness. Normal range of motion.     Cervical back: Normal range of motion and neck supple.     Right lower leg: No edema.     Left lower leg: No edema.  Skin:    General: Skin is warm and dry.     Capillary Refill: Capillary refill takes less than 2 seconds.  Neurological:     General: No focal deficit present.     Mental Status: She is alert and oriented to person, place, and time.     Deep Tendon Reflexes: Reflexes normal.  Psychiatric:        Mood and Affect: Mood normal.        Behavior: Behavior normal.     ED Results /  Procedures / Treatments   Labs (all labs ordered are listed, but only abnormal results are displayed) Labs Reviewed  CBC WITH DIFFERENTIAL/PLATELET - Abnormal; Notable for the following components:      Result Value   WBC 11.4 (*)    RBC 3.46 (*)    Hemoglobin 9.1 (*)    HCT 29.2 (*)    All other components within normal limits  TROPONIN I (HIGH SENSITIVITY)    EKG EKG Interpretation  Date/Time:  Saturday August 29 2020 23:52:29 EDT Ventricular Rate:  105 PR Interval:    QRS Duration: 84 QT Interval:  318 QTC Calculation: 421 R Axis:   39 Text Interpretation: Sinus tachycardia Confirmed by Nicanor Alcon, Kambria Grima ([id number]) on 08/30/2020 12:33:22 AM   Radiology No results found.  Procedures Procedures (including critical care time)  Medications Ordered in ED Medications - No data to display  ED Course  I have reviewed the triage vital signs and the nursing notes.  Pertinent labs & imaging results that were available during my care of the patient were reviewed by me and considered in my medical decision making (see chart for details).   I suspect the palpitations are related to marijuana.  Ruled out for Mi and PE in the ED.  With nurse present patient informed of multiple breast masses seen on CTA and need for close follow up with breast imaging.  The number of [facility] wellness and the breast center was given to the patient to call to arrange close follow up and imaging.  EDP stressed this needed to be done very soon given radiology finding on CT scan.  Patient expressed understanding and agrees to follow up for ongoing testing and care of breast masses seen on CT scan.    Shelly Ramos was evaluated in Emergency Department on 08/30/2020 for the symptoms described in the history of present illness. She was evaluated in the context of the global COVID-19 pandemic, which necessitated consideration that the patient might be at risk for infection with the SARS-CoV-2 virus  that causes COVID-19. Institutional protocols and algorithms that pertain to the evaluation of patients at risk for COVID-19 are in a state of rapid change based on information released by regulatory bodies including the CDC and federal and state organizations. These policies and algorithms were followed during the patient's care in  the ED.  Final Clinical Impression(s) / ED Diagnoses Return for intractable cough, coughing up blood,fevers >100.4 unrelieved by medication, shortness of breath, intractable vomiting, chest pain, shortness of breath, weakness,numbness, changes in speech, facial asymmetry,abdominal pain, passing out,Inability to tolerate liquids or food, cough, altered mental status or any concerns. No signs of systemic illness or infection. The patient is nontoxic-appearing on exam and vital signs are within normal limits.   I have reviewed the triage vital signs and the nursing notes. Pertinent labs &imaging results that were available during my care of the patient were reviewed by me and considered in my medical decision making (see chart for details).After history, exam, and medical workup I feel the patient has beenappropriately medically screened and is safe for discharge home. Pertinent diagnoses were discussed with the patient. Patient was given return precautions.    Draydon Clairmont, MD 08/30/20 [id number]

## 2020-08-30 NOTE — ED Notes (Signed)
This RN at bedside with EDP to notify pt of CT scan results to follow up with OB-GYN as soon as possible. Pt verbalize understanding.

## 2021-11-27 IMAGING — CT CT ANGIO CHEST
2 of 8 series · 19 of 36 positions shown · IV contrast (Omnipaque)
Comparison: None.

CLINICAL DATA: Left-sided chest pain and palpitations.

EXAM:
CT ANGIOGRAPHY CHEST WITH CONTRAST
TECHNIQUE: Multidetector CT imaging of the chest was performed using the
standard protocol during bolus administration of intravenous
contrast. Multiplanar CT image reconstructions and MIPs were
obtained to evaluate the vascular anatomy.
CONTRAST:  100mL OMNIPAQUE IOHEXOL 350 MG/ML SOLN

[Series 6: pe coronal mpr · coronal · 0.54mm/px · 1 of 149 slices shown]
[im 75/149  mediastinal]
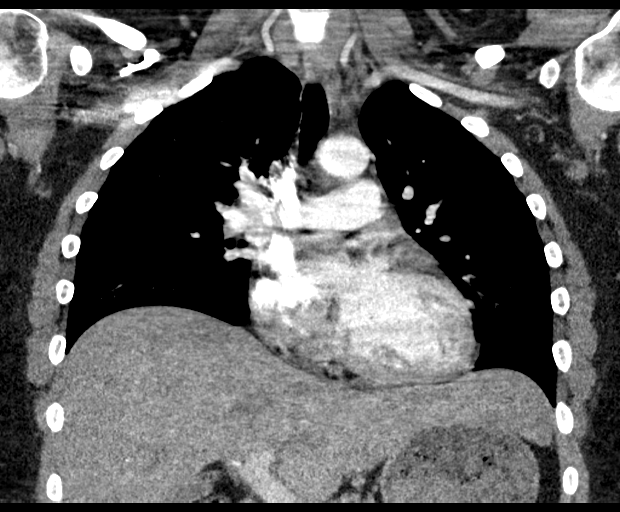

[Series 10: pe thins · axial · 0.73mm/px · z∈[+834,+1070]mm · 18 of 265 slices shown]
[im 14/265  lung]
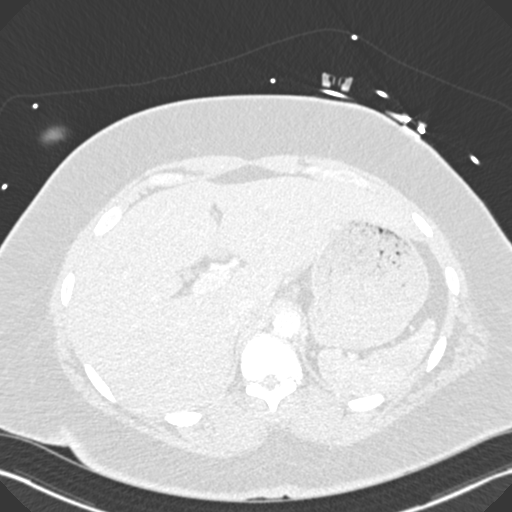
[im 28/265  mediastinal]
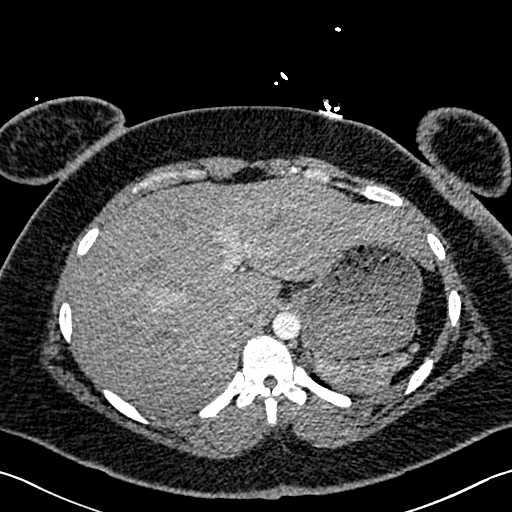
[im 42/265  lung]
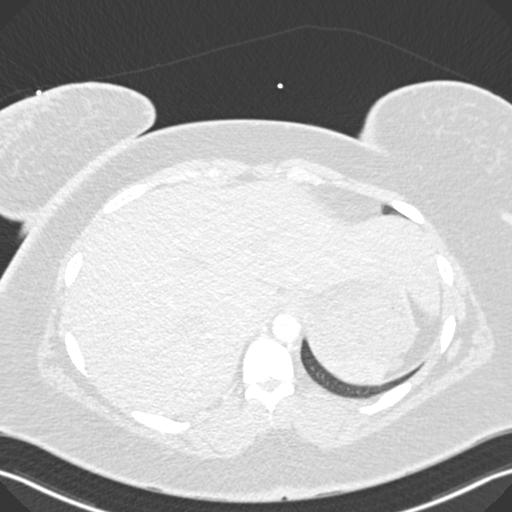
[im 56/265  mediastinal]
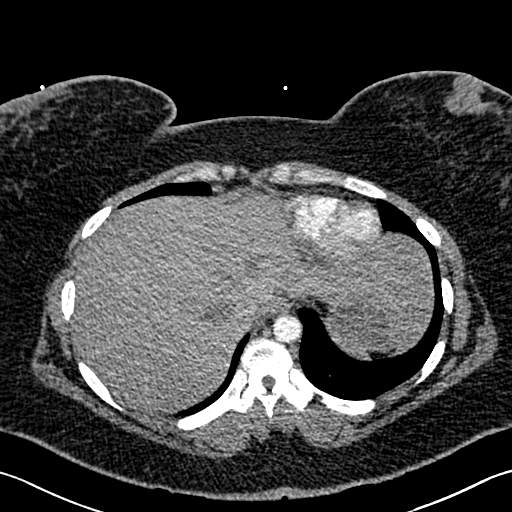
[im 70/265  lung]
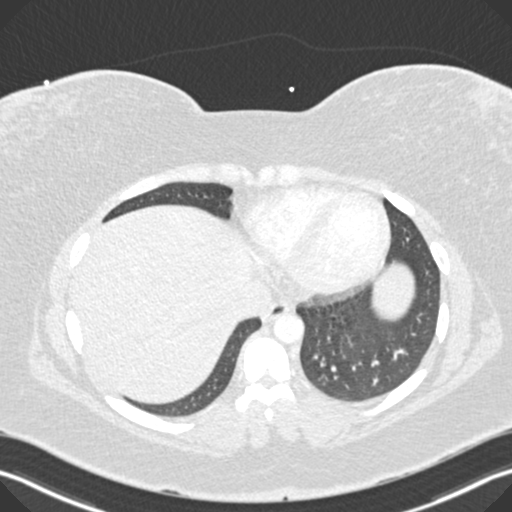
[im 84/265  mediastinal]
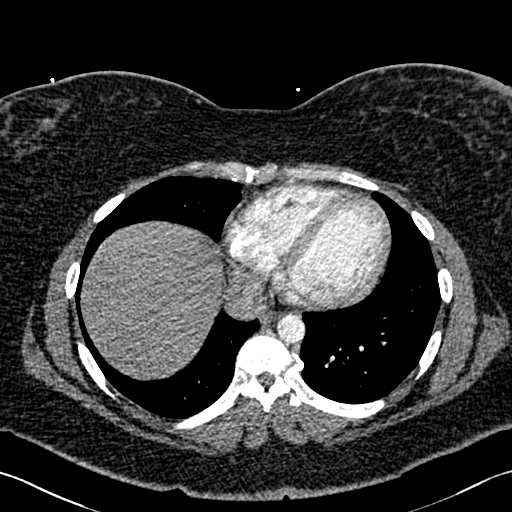
[im 98/265  lung]
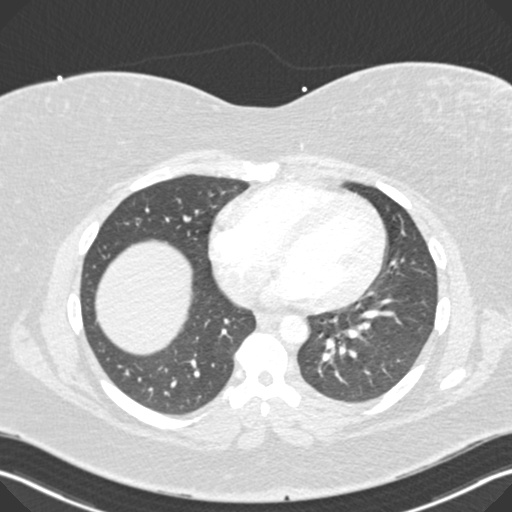
[im 112/265  mediastinal]
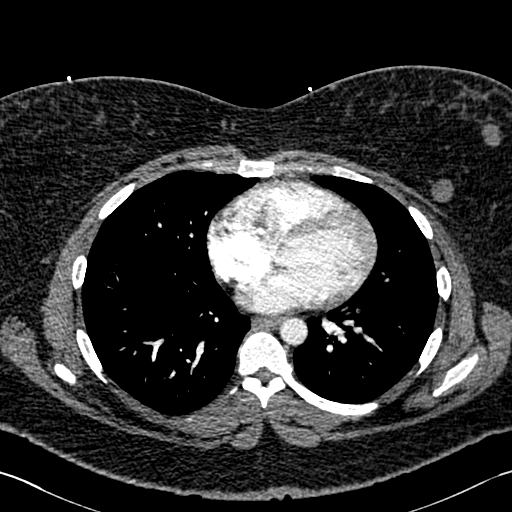
[im 126/265  lung]
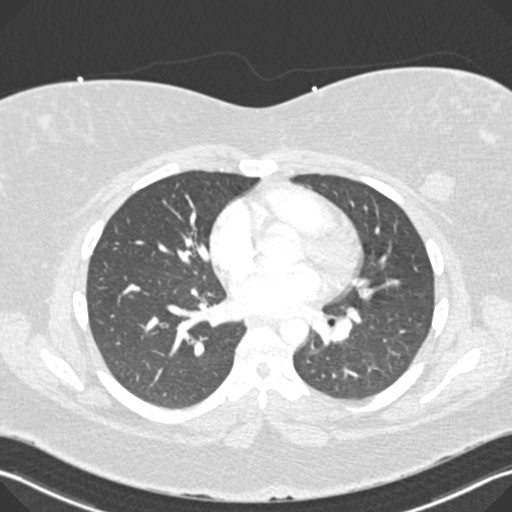
[im 139/265  mediastinal]
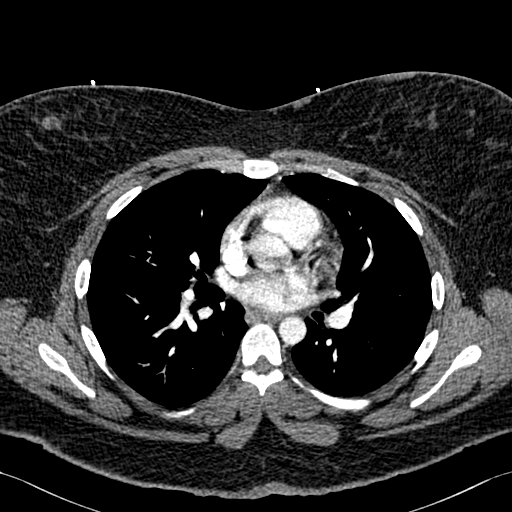
[im 153/265  lung]
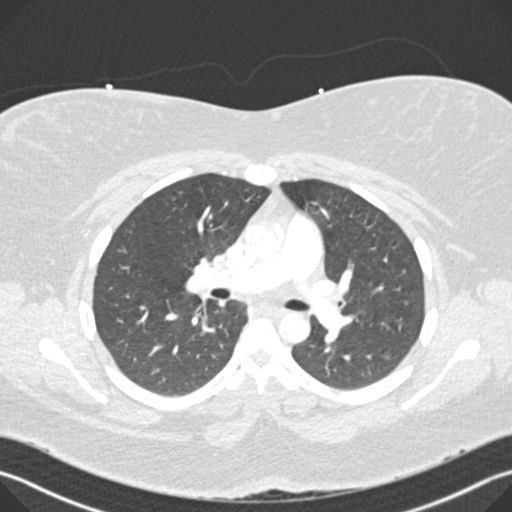
[im 167/265  mediastinal]
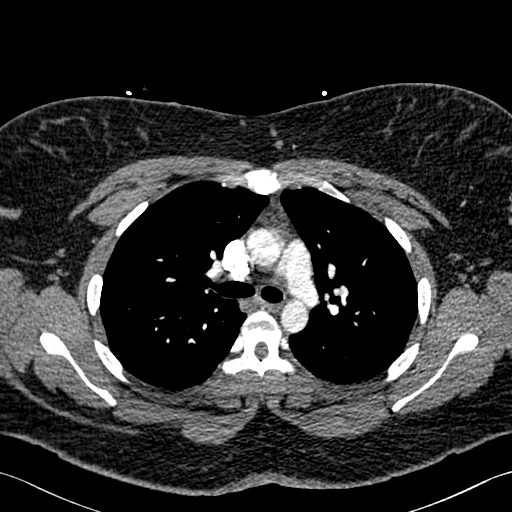
[im 181/265  lung]
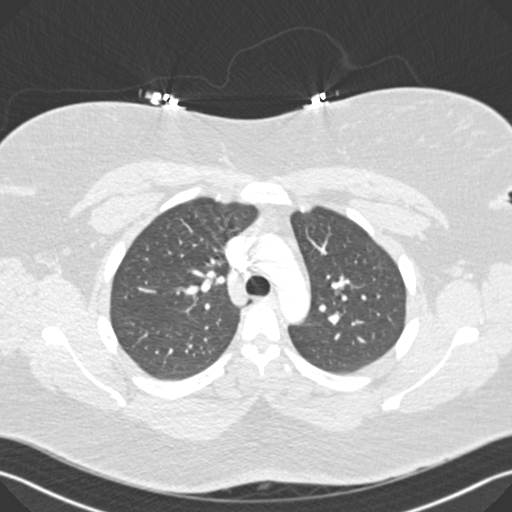
[im 195/265  mediastinal]
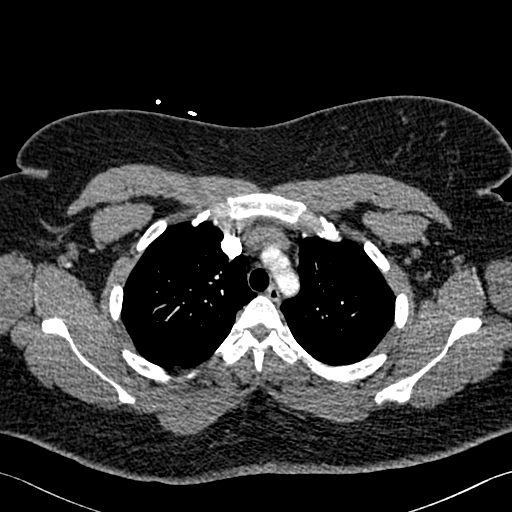
[im 209/265  lung]
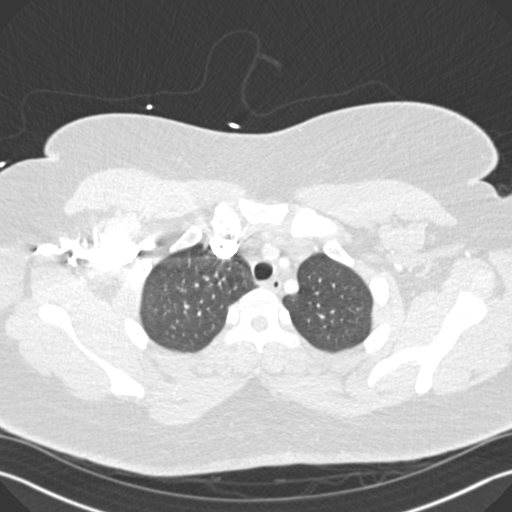
[im 223/265  mediastinal]
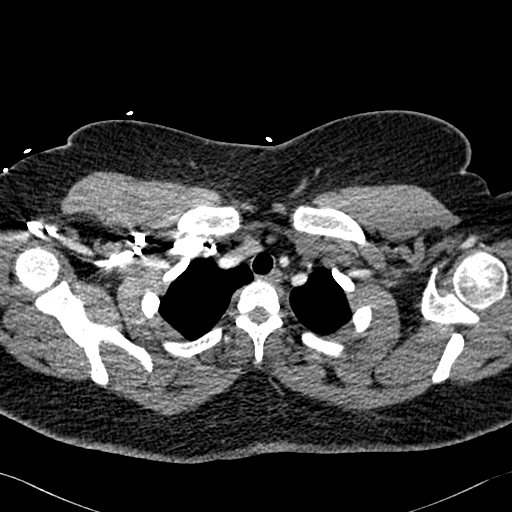
[im 237/265  lung]
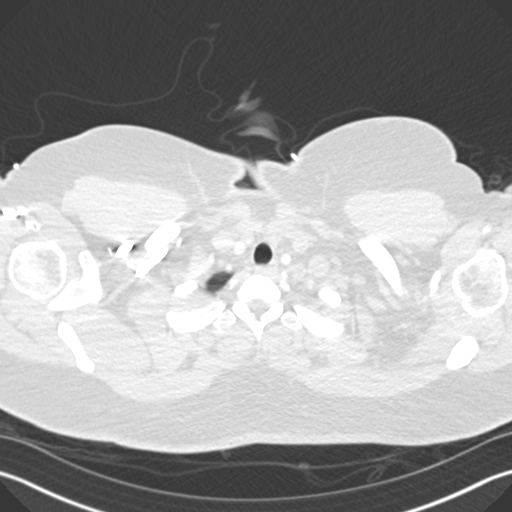
[im 251/265  mediastinal]
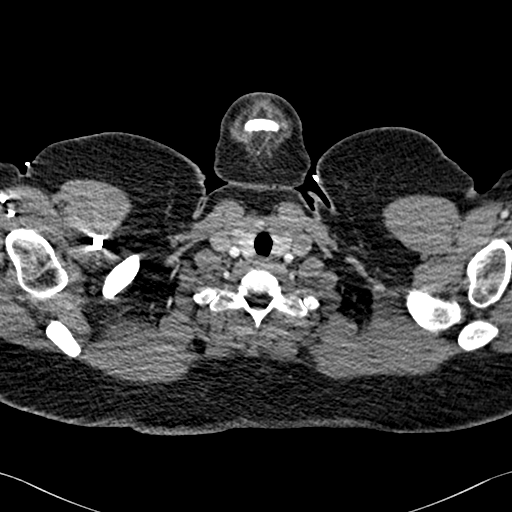

[19 of 36 positions shown; findings below may reference images not displayed]

FINDINGS: Cardiovascular: Satisfactory opacification of the pulmonary arteries
to the segmental level. No evidence of pulmonary embolism. Normal
heart size. No pericardial effusion.

Mediastinum/Nodes: No enlarged mediastinal, hilar, or axillary lymph
nodes. Thyroid gland, trachea, and esophagus demonstrate no
significant findings.

Lungs/Pleura: Lungs are clear. No pleural effusion or pneumothorax.

Upper Abdomen: No acute abnormality.

Musculoskeletal: No chest wall abnormality.

1.3 cm x 1.6 cm and 2.1 cm x 2.0 cm well-defined areas of soft
tissue attenuation are seen within the left breast. A similar
appearing 1.7 cm x 1.2 cm area of soft tissue attenuation is seen
within the right breast.

No acute or significant osseous findings.

Review of the MIP images confirms the above findings.
IMPRESSION: 1. No CT evidence of pulmonary embolism.
2. Bilateral breast soft tissue masses. While these may represent
large lymph nodes, further evaluation is recommended to exclude the
presence of an underlying neoplasm. Consultation with the [REDACTED] division is recommended.
